# Patient Record
Sex: Male | Born: 1975 | Race: Black or African American | Hispanic: No | Marital: Single | Smoking: Former smoker
Health system: Southern US, Community
[De-identification: ages and names within clinical notes are randomized; demographics above are authoritative.]

## PROBLEM LIST (undated history)

## (undated) DIAGNOSIS — S069XAA Unspecified intracranial injury with loss of consciousness status unknown, initial encounter: Secondary | ICD-10-CM

## (undated) DIAGNOSIS — R569 Unspecified convulsions: Secondary | ICD-10-CM

## (undated) DIAGNOSIS — S069X9A Unspecified intracranial injury with loss of consciousness of unspecified duration, initial encounter: Secondary | ICD-10-CM

## (undated) DIAGNOSIS — F329 Major depressive disorder, single episode, unspecified: Secondary | ICD-10-CM

## (undated) DIAGNOSIS — F32A Depression, unspecified: Secondary | ICD-10-CM

## (undated) HISTORY — PX: BRAIN SURGERY: SHX531

---

## 2012-09-09 ENCOUNTER — Encounter (HOSPITAL_COMMUNITY): Payer: Self-pay | Admitting: *Deleted

## 2012-09-09 ENCOUNTER — Emergency Department (HOSPITAL_COMMUNITY)
Admission: EM | Admit: 2012-09-09 | Discharge: 2012-09-09 | Disposition: A | Discharge status: Home / Self Care | Payer: Medicaid - Out of State | Attending: Emergency Medicine | Admitting: Emergency Medicine

## 2012-09-09 DIAGNOSIS — Z791 Long term (current) use of non-steroidal anti-inflammatories (NSAID): Secondary | ICD-10-CM | POA: Insufficient documentation

## 2012-09-09 DIAGNOSIS — Z87820 Personal history of traumatic brain injury: Secondary | ICD-10-CM | POA: Insufficient documentation

## 2012-09-09 DIAGNOSIS — Z87891 Personal history of nicotine dependence: Secondary | ICD-10-CM | POA: Insufficient documentation

## 2012-09-09 DIAGNOSIS — G40919 Epilepsy, unspecified, intractable, without status epilepticus: Secondary | ICD-10-CM | POA: Insufficient documentation

## 2012-09-09 DIAGNOSIS — R569 Unspecified convulsions: Secondary | ICD-10-CM

## 2012-09-09 DIAGNOSIS — Z79899 Other long term (current) drug therapy: Secondary | ICD-10-CM | POA: Insufficient documentation

## 2012-09-09 HISTORY — DX: Unspecified intracranial injury with loss of consciousness status unknown, initial encounter: S06.9XAA

## 2012-09-09 HISTORY — DX: Unspecified intracranial injury with loss of consciousness of unspecified duration, initial encounter: S06.9X9A

## 2012-09-09 HISTORY — DX: Unspecified convulsions: R56.9

## 2012-09-09 LAB — COMPREHENSIVE METABOLIC PANEL
ALT: 9 U/L (ref 0–53)
AST: 17 U/L (ref 0–37)
Albumin: 3.8 g/dL (ref 3.5–5.2)
CO2: 23 mEq/L (ref 19–32)
Calcium: 9.1 mg/dL (ref 8.4–10.5)
Creatinine, Ser: 0.88 mg/dL (ref 0.50–1.35)
GFR calc non Af Amer: >90 mL/min (ref >90)
Sodium: 138 mEq/L (ref 135–145)
Total Protein: 7.5 g/dL (ref 6.0–8.3)

## 2012-09-09 LAB — CBC WITH DIFFERENTIAL/PLATELET
Basophils Absolute: 0.1 10*3/uL (ref 0.0–0.1)
Eosinophils Relative: 2 % (ref 0–5)
Lymphs Abs: 2.1 10*3/uL (ref 0.7–4.0)
MCH: 32 pg (ref 26.0–34.0)
Monocytes Absolute: 0.4 10*3/uL (ref 0.1–1.0)
Neutrophils Relative %: 63 % (ref 43–77)
Platelets: 209 10*3/uL (ref 150–400)
RBC: 4.34 MIL/uL (ref 4.22–5.81)
RDW: 13.5 % (ref 11.5–15.5)
WBC: 7.3 10*3/uL (ref 4.0–10.5)

## 2012-09-09 LAB — URINALYSIS, ROUTINE W REFLEX MICROSCOPIC
Bilirubin Urine: NEGATIVE
Glucose, UA: NEGATIVE mg/dL
Hgb urine dipstick: NEGATIVE
Specific Gravity, Urine: 1.009 (ref 1.005–1.030)
Urobilinogen, UA: 0.2 mg/dL (ref 0.0–1.0)
pH: 7 (ref 5.0–8.0)

## 2012-09-09 MED ORDER — SODIUM CHLORIDE 0.9 % IV SOLN
1000.0000 mg | Freq: Once | INTRAVENOUS | Status: AC
  Administered 2012-09-09: 1000 mg via INTRAVENOUS
  Filled 2012-09-09: qty 10

## 2012-09-09 MED ORDER — TOPIRAMATE 100 MG PO TABS: 100.0000 mg | ORAL_TABLET | Freq: Two times a day (BID) | ORAL | Status: DC

## 2012-09-09 MED ORDER — MORPHINE SULFATE 4 MG/ML IJ SOLN
4.0000 mg | Freq: Once | INTRAMUSCULAR | Status: AC
  Administered 2012-09-09: 2 mg via INTRAVENOUS
  Filled 2012-09-09: qty 1

## 2012-09-09 MED ORDER — LEVETIRACETAM 500 MG PO TABS: 1000.0000 mg | ORAL_TABLET | Freq: Two times a day (BID) | ORAL | Status: DC

## 2012-09-09 MED ORDER — ONDANSETRON HCL 4 MG/2ML IJ SOLN
4.0000 mg | Freq: Once | INTRAMUSCULAR | Status: AC
  Administered 2012-09-09: 4 mg via INTRAVENOUS
  Filled 2012-09-09: qty 2
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
## 2012-09-09 NOTE — ED Notes (Signed)
Pt arrives by [facility] with c/o witnessed seizure. Pt has hx of same. EMS reports pt was post-ictal on their arrival pt now A&Ox's.4

## 2012-09-09 NOTE — ED Notes (Signed)
ZOX:[id number]<AV> Expected date:09/09/12<BR> Expected time:11:19 AM<BR> Means of arrival:Ambulance<BR> Comments:<BR> Seizures

## 2012-09-09 NOTE — ED Notes (Signed)
Pt reports feeling light headed and dizzy at present.

## 2012-09-10 NOTE — ED Provider Notes (Signed)
Medical screening examination/treatment/procedure(s) were performed by non-physician practitioner and as supervising physician I was immediately available for consultation/collaboration.  Derwood Kaplan, MD 09/10/12 [phone]

## 2012-09-27 ENCOUNTER — Encounter (HOSPITAL_COMMUNITY): Payer: Self-pay | Admitting: Emergency Medicine

## 2012-09-27 ENCOUNTER — Emergency Department (HOSPITAL_COMMUNITY)
Admission: EM | Admit: 2012-09-27 | Discharge: 2012-09-27 | Disposition: A | Discharge status: Home / Self Care | Payer: Medicaid - Out of State | Attending: Emergency Medicine | Admitting: Emergency Medicine

## 2012-09-27 DIAGNOSIS — Z79899 Other long term (current) drug therapy: Secondary | ICD-10-CM | POA: Insufficient documentation

## 2012-09-27 DIAGNOSIS — R569 Unspecified convulsions: Secondary | ICD-10-CM

## 2012-09-27 DIAGNOSIS — Z87891 Personal history of nicotine dependence: Secondary | ICD-10-CM | POA: Insufficient documentation

## 2012-09-27 DIAGNOSIS — G40909 Epilepsy, unspecified, not intractable, without status epilepticus: Secondary | ICD-10-CM | POA: Insufficient documentation

## 2012-09-27 DIAGNOSIS — Z87820 Personal history of traumatic brain injury: Secondary | ICD-10-CM | POA: Insufficient documentation

## 2012-09-27 NOTE — ED Notes (Addendum)
Pt via ems s/p seizure witnessed lasting > . Pt now aox4 does not remember Sz. Pt stated that he has not taken his Topamax for 3 days

## 2012-09-27 NOTE — ED Notes (Signed)
Pt waiting to see Child psychotherapist

## 2012-09-27 NOTE — Progress Notes (Signed)
ED Cm spoke with Claris Che at [address] [place]  [phone] to confirm pt medications Topamax and Keppra are at the pharmacy (since 09/25/12) ready for pick up at zero cost CM updated pt and ED RN, Donell Beers. RN voiced concern with transportation for pt Referred to ED SW. CM called and spoke with ED SW about transportation concerns of pt CM signing off
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
## 2012-09-27 NOTE — Progress Notes (Signed)
CSW was consulted by RN, Donell Beers re: pt transportation back to [employer].  CSW will request assistance from PM CSW re: this consult.    Vickii Penna, LCSWA [phone]  Clinical Social Work

## 2012-09-27 NOTE — Progress Notes (Signed)
[facility] ED CM consulted by EDP, Bednar to assist with medications for this seizure pt CM assess pt who states he receives a disability check that is pending.  Pt states he has medicaid.  [facility] [address] in [place] but unable to do so.  Cm called and spoke with sylvia at [facility] at [address] [place], [place] [address] [phone] who is unable to find the pt listed at this [facility] nor [facility] main data base

## 2012-09-27 NOTE — Progress Notes (Signed)
CSW received referral for transportation assistance. CSW met with pt to assess. Patient provided with cab voucher to [place]. .No further Clinical Social Work needs, signing off.   Catha Gosselin, LCSWA  [phone] .09/27/2012 1516pm

## 2012-09-27 NOTE — ED Notes (Signed)
ZOX:WR60<AV> Expected date:<BR> Expected time:<BR> Means of arrival:<BR> Comments:<BR> seizure

## 2012-09-27 NOTE — Progress Notes (Signed)
[facility] ED CM obtained a copy of patient's insurance card and gave to [place] in ED registration for updating in EPIC.  [facility] [place] pcp listed as [facility]

## 2014-05-24 ENCOUNTER — Emergency Department (HOSPITAL_COMMUNITY)
Admission: EM | Admit: 2014-05-24 | Discharge: 2014-05-24 | Disposition: A | Discharge status: Home / Self Care | Payer: Medicaid Other | Attending: Emergency Medicine | Admitting: Emergency Medicine

## 2014-05-24 ENCOUNTER — Encounter (HOSPITAL_COMMUNITY): Payer: Self-pay | Admitting: Emergency Medicine

## 2014-05-24 ENCOUNTER — Emergency Department (HOSPITAL_COMMUNITY): Payer: Medicaid Other

## 2014-05-24 DIAGNOSIS — G40909 Epilepsy, unspecified, not intractable, without status epilepticus: Secondary | ICD-10-CM | POA: Diagnosis not present

## 2014-05-24 DIAGNOSIS — Z79899 Other long term (current) drug therapy: Secondary | ICD-10-CM | POA: Diagnosis not present

## 2014-05-24 DIAGNOSIS — Z87820 Personal history of traumatic brain injury: Secondary | ICD-10-CM | POA: Insufficient documentation

## 2014-05-24 DIAGNOSIS — Z87891 Personal history of nicotine dependence: Secondary | ICD-10-CM | POA: Diagnosis not present

## 2014-05-24 DIAGNOSIS — R0789 Other chest pain: Secondary | ICD-10-CM | POA: Diagnosis not present

## 2014-05-24 DIAGNOSIS — R079 Chest pain, unspecified: Secondary | ICD-10-CM | POA: Diagnosis present

## 2014-05-24 LAB — D-DIMER, QUANTITATIVE: D-Dimer, Quant: 0.39 ug/mL-FEU (ref 0.00–0.48)

## 2014-05-24 MED ORDER — NAPROXEN 500 MG PO TABS: 500.0000 mg | ORAL_TABLET | Freq: Two times a day (BID) | ORAL | Status: DC

## 2014-05-24 NOTE — Discharge Instructions (Signed)
Your evaluation today for chest discomfort in ED did not show any acute or emergent cause for your symptoms. Take course of naproxen for one week to treat associated chest discomfort. You may followup with your primary care for a repeat chest x-ray in 1 week if symptoms have not improved.  Chest Wall Pain Chest wall pain is pain felt in or around the chest bones and muscles. It may take up to 6 weeks to get better. It may take longer if you are active. Chest wall pain can happen on its own. Other times, things like germs, injury, coughing, or exercise can cause the pain. HOME CARE   Avoid activities that make you tired or cause pain. Try not to use your chest, belly (abdominal), or side muscles. Do not use heavy weights.  Put ice on the sore area.  Put ice in a plastic bag.  Place a towel between your skin and the bag.  Leave the ice on for 15-20 minutes for the first 2 days.  Only take medicine as told by your doctor. GET HELP RIGHT AWAY IF:   You have more pain or are very uncomfortable.  You have a fever.  Your chest pain gets worse.  You have new problems.  You feel sick to your stomach (nauseous) or throw up (vomit).  You start to sweat or feel lightheaded.  You have a cough with mucus (phlegm).  You cough up blood. MAKE SURE YOU:   Understand these instructions.  Will watch your condition.  Will get help right away if you are not doing well or get worse. Document Released: 01/07/2008 Document Revised: 10/13/2011 Document Reviewed: 03/17/2011 [facility] Patient Information 2015 [employer], [place]. This information is not intended to replace advice given to you by your health care provider. Make sure you discuss any questions you have with your health care provider.

## 2014-05-24 NOTE — ED Notes (Signed)
Pt having right rib pain when he breathes. sts he received a flu shot yesterday and hurting since. Pt sts also he had a seizure yesterday. Hx of same.
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
## 2014-05-24 NOTE — ED Provider Notes (Signed)
Medical screening examination/treatment/procedure(s) were performed by non-physician practitioner and as supervising physician I was immediately available for consultation/collaboration.   EKG Interpretation   Date/Time:  Wednesday May 24 2014 16:58:26 EDT Ventricular Rate:  102 PR Interval:  140 QRS Duration: 78 QT Interval:  314 QTC Calculation: 409 R Axis:   107 Text Interpretation:  Sinus tachycardia Biatrial enlargement Right  ventricular hypertrophy No previous tracing Confirmed by Anitra LauthPLUNKETT  MD,  Alphonzo LemmingsWHITNEY ([id number]) on 05/24/2014 8:46:56 PM        Gwyneth SproutWhitney Avelina Mcclurkin, MD 05/24/14 2250

## 2014-07-21 ENCOUNTER — Encounter (HOSPITAL_COMMUNITY): Payer: Self-pay | Admitting: *Deleted

## 2014-07-21 ENCOUNTER — Emergency Department (HOSPITAL_COMMUNITY)
Admission: EM | Admit: 2014-07-21 | Discharge: 2014-07-21 | Disposition: A | Discharge status: Home / Self Care | Payer: Medicaid Other | Attending: Emergency Medicine | Admitting: Emergency Medicine

## 2014-07-21 DIAGNOSIS — G40909 Epilepsy, unspecified, not intractable, without status epilepticus: Secondary | ICD-10-CM | POA: Insufficient documentation

## 2014-07-21 DIAGNOSIS — Z87820 Personal history of traumatic brain injury: Secondary | ICD-10-CM | POA: Diagnosis not present

## 2014-07-21 DIAGNOSIS — R569 Unspecified convulsions: Secondary | ICD-10-CM

## 2014-07-21 DIAGNOSIS — Z720 Tobacco use: Secondary | ICD-10-CM | POA: Insufficient documentation

## 2014-07-21 DIAGNOSIS — Z791 Long term (current) use of non-steroidal anti-inflammatories (NSAID): Secondary | ICD-10-CM | POA: Diagnosis not present

## 2014-07-21 LAB — URINALYSIS, ROUTINE W REFLEX MICROSCOPIC
Bilirubin Urine: NEGATIVE
GLUCOSE, UA: NEGATIVE mg/dL
Hgb urine dipstick: NEGATIVE
KETONES UR: 40 mg/dL — AB
Nitrite: NEGATIVE
Protein, ur: NEGATIVE mg/dL
Specific Gravity, Urine: 1.037 — ABNORMAL HIGH (ref 1.005–1.030)
Urobilinogen, UA: 1 mg/dL (ref 0.0–1.0)
pH: 7.5 (ref 5.0–8.0)

## 2014-07-21 LAB — URINE MICROSCOPIC-ADD ON

## 2014-07-21 LAB — I-STAT CHEM 8, ED
BUN: 18 mg/dL (ref 6–23)
CALCIUM ION: 1.15 mmol/L (ref 1.12–1.23)
CREATININE: 0.9 mg/dL (ref 0.50–1.35)
Chloride: 103 mEq/L (ref 96–112)
Glucose, Bld: 93 mg/dL (ref 70–99)
HCT: 46 % (ref 39.0–52.0)
Hemoglobin: 15.6 g/dL (ref 13.0–17.0)
Potassium: 4.1 mEq/L (ref 3.7–5.3)
Sodium: 141 mEq/L (ref 137–147)
TCO2: 24 mmol/L (ref 0–100)

## 2014-07-21 LAB — VALPROIC ACID LEVEL: VALPROIC ACID LVL: 48.6 ug/mL — AB (ref 50.0–100.0)

## 2014-07-21 MED ORDER — CARBAMAZEPINE 200 MG PO TABS
200.0000 mg | ORAL_TABLET | Freq: Once | ORAL | Status: AC
  Administered 2014-07-21: 200 mg via ORAL
  Filled 2014-07-21: qty 1

## 2014-07-21 MED ORDER — DIVALPROEX SODIUM 250 MG PO DR TAB
500.0000 mg | DELAYED_RELEASE_TABLET | Freq: Once | ORAL | Status: AC
  Administered 2014-07-21: 500 mg via ORAL
  Filled 2014-07-21: qty 2

## 2014-07-21 MED ORDER — CARBAMAZEPINE 200 MG PO TABS: 200.0000 mg | ORAL_TABLET | Freq: Two times a day (BID) | ORAL | Status: DC

## 2014-07-21 NOTE — ED Notes (Signed)
Pt given urinal to provide sample 
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
## 2014-07-21 NOTE — ED Notes (Signed)
Pt to ED via [facility] c/o three seizure like activities at home. Has been out of Tegretol x 3 days and is only taking Keppra. Family reported seizures lasted about 6 minutes and was postical x 15 minutes after the last seizure, which is abnormal. Pt is A&Ox 4 on arrival. VS bp 133/90; HR 81; spO2 99% CBG 104

## 2014-07-21 NOTE — ED Notes (Addendum)
Pt c/o L sided headache after seizures at home today. Has hx of seizures; has been out of tegretol x 3 days and has only been taking Keppra. Family reported to EMS that seizures have last 6 minutes; last seizure included a 15 minute postical phase, which is abnormal for patient. Pt a&ox 4 on arrival.

## 2014-07-21 NOTE — Discharge Instructions (Signed)
Return to the ED with any concerns including recurrent seizure activity, difficulty breathing, fainting, vomiting and not able to keep down medications or liquids, decreased level of alertness/lethargy, or any other alarming symptoms

## 2014-08-30 ENCOUNTER — Encounter (HOSPITAL_COMMUNITY): Payer: Self-pay | Admitting: Emergency Medicine

## 2014-08-30 ENCOUNTER — Emergency Department (HOSPITAL_COMMUNITY)
Admission: EM | Admit: 2014-08-30 | Discharge: 2014-08-30 | Disposition: A | Discharge status: Home / Self Care | Payer: Medicaid Other | Attending: Emergency Medicine | Admitting: Emergency Medicine

## 2014-08-30 DIAGNOSIS — Y998 Other external cause status: Secondary | ICD-10-CM | POA: Diagnosis not present

## 2014-08-30 DIAGNOSIS — Z87820 Personal history of traumatic brain injury: Secondary | ICD-10-CM | POA: Insufficient documentation

## 2014-08-30 DIAGNOSIS — S3992XA Unspecified injury of lower back, initial encounter: Secondary | ICD-10-CM | POA: Diagnosis not present

## 2014-08-30 DIAGNOSIS — Z720 Tobacco use: Secondary | ICD-10-CM | POA: Diagnosis not present

## 2014-08-30 DIAGNOSIS — Z23 Encounter for immunization: Secondary | ICD-10-CM | POA: Diagnosis not present

## 2014-08-30 DIAGNOSIS — G40909 Epilepsy, unspecified, not intractable, without status epilepticus: Secondary | ICD-10-CM | POA: Diagnosis not present

## 2014-08-30 DIAGNOSIS — M545 Low back pain, unspecified: Secondary | ICD-10-CM

## 2014-08-30 DIAGNOSIS — Z79899 Other long term (current) drug therapy: Secondary | ICD-10-CM | POA: Insufficient documentation

## 2014-08-30 DIAGNOSIS — X58XXXA Exposure to other specified factors, initial encounter: Secondary | ICD-10-CM | POA: Diagnosis not present

## 2014-08-30 DIAGNOSIS — R569 Unspecified convulsions: Secondary | ICD-10-CM | POA: Diagnosis present

## 2014-08-30 DIAGNOSIS — Y9289 Other specified places as the place of occurrence of the external cause: Secondary | ICD-10-CM | POA: Diagnosis not present

## 2014-08-30 DIAGNOSIS — Y9389 Activity, other specified: Secondary | ICD-10-CM | POA: Insufficient documentation

## 2014-08-30 LAB — VALPROIC ACID LEVEL: Valproic Acid Lvl: 56.9 ug/mL (ref 50.0–100.0)

## 2014-08-30 LAB — BASIC METABOLIC PANEL
Anion gap: 9 (ref 5–15)
BUN: 15 mg/dL (ref 6–23)
CALCIUM: 9.7 mg/dL (ref 8.4–10.5)
CO2: 25 mmol/L (ref 19–32)
Chloride: 102 mmol/L (ref 96–112)
Creatinine, Ser: 0.99 mg/dL (ref 0.50–1.35)
GFR calc Af Amer: >90 mL/min (ref >90)
Glucose, Bld: 102 mg/dL — ABNORMAL HIGH (ref 70–99)
POTASSIUM: 4.3 mmol/L (ref 3.5–5.1)
Sodium: 136 mmol/L (ref 135–145)

## 2014-08-30 LAB — CBC
HCT: 41 % (ref 39.0–52.0)
HEMOGLOBIN: 14.3 g/dL (ref 13.0–17.0)
MCH: 32 pg (ref 26.0–34.0)
MCHC: 34.9 g/dL (ref 30.0–36.0)
MCV: 91.7 fL (ref 78.0–100.0)
Platelets: 216 10*3/uL (ref 150–400)
RBC: 4.47 MIL/uL (ref 4.22–5.81)
RDW: 14 % (ref 11.5–15.5)
WBC: 11.1 10*3/uL — ABNORMAL HIGH (ref 4.0–10.5)

## 2014-08-30 LAB — CARBAMAZEPINE LEVEL, TOTAL

## 2014-08-30 MED ORDER — CARBAMAZEPINE 200 MG PO TABS
200.0000 mg | ORAL_TABLET | Freq: Once | ORAL | Status: AC
  Administered 2014-08-30: 200 mg via ORAL
  Filled 2014-08-30: qty 1

## 2014-08-30 MED ORDER — CARBAMAZEPINE 200 MG PO TABS: 200.0000 mg | ORAL_TABLET | Freq: Two times a day (BID) | ORAL | Status: DC

## 2014-08-30 MED ORDER — OXYCODONE-ACETAMINOPHEN 5-325 MG PO TABS
1.0000 | ORAL_TABLET | Freq: Once | ORAL | Status: AC
  Administered 2014-08-30: 1 via ORAL
  Filled 2014-08-30: qty 1

## 2014-08-30 MED ORDER — HYDROCODONE-ACETAMINOPHEN 5-325 MG PO TABS: 1.0000 | ORAL_TABLET | Freq: Four times a day (QID) | ORAL | Status: AC | PRN

## 2014-08-30 MED ORDER — TETANUS-DIPHTH-ACELL PERTUSSIS 5-2.5-18.5 LF-MCG/0.5 IM SUSP
0.5000 mL | Freq: Once | INTRAMUSCULAR | Status: AC
  Administered 2014-08-30: 0.5 mL via INTRAMUSCULAR
  Filled 2014-08-30: qty 0.5
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
## 2014-08-30 NOTE — ED Notes (Signed)
Pt presents with multiple seizures over the past 3 days, last seizure was around 4pm.  Pt admits to being out of Carbamazepine for the past week.  Pt currently alert and oriented X 4.  Pt admits to having pain to his right lower back after falling after one of his seizures.  Denies other injuries.

## 2014-08-30 NOTE — ED Notes (Signed)
NAD at this time. Pt is stable and going home with his mother.

## 2014-10-17 ENCOUNTER — Encounter (HOSPITAL_COMMUNITY): Payer: Self-pay | Admitting: Emergency Medicine

## 2014-10-17 ENCOUNTER — Emergency Department (HOSPITAL_COMMUNITY)
Admission: EM | Admit: 2014-10-17 | Discharge: 2014-10-17 | Disposition: A | Discharge status: Home / Self Care | Payer: Medicaid Other | Attending: Emergency Medicine | Admitting: Emergency Medicine

## 2014-10-17 ENCOUNTER — Emergency Department (HOSPITAL_COMMUNITY): Payer: Medicaid Other

## 2014-10-17 DIAGNOSIS — Y929 Unspecified place or not applicable: Secondary | ICD-10-CM | POA: Diagnosis not present

## 2014-10-17 DIAGNOSIS — S0003XA Contusion of scalp, initial encounter: Secondary | ICD-10-CM | POA: Insufficient documentation

## 2014-10-17 DIAGNOSIS — W01198A Fall on same level from slipping, tripping and stumbling with subsequent striking against other object, initial encounter: Secondary | ICD-10-CM | POA: Insufficient documentation

## 2014-10-17 DIAGNOSIS — S161XXA Strain of muscle, fascia and tendon at neck level, initial encounter: Secondary | ICD-10-CM | POA: Insufficient documentation

## 2014-10-17 DIAGNOSIS — Z720 Tobacco use: Secondary | ICD-10-CM | POA: Diagnosis not present

## 2014-10-17 DIAGNOSIS — Z79899 Other long term (current) drug therapy: Secondary | ICD-10-CM | POA: Insufficient documentation

## 2014-10-17 DIAGNOSIS — Y939 Activity, unspecified: Secondary | ICD-10-CM | POA: Insufficient documentation

## 2014-10-17 DIAGNOSIS — R569 Unspecified convulsions: Secondary | ICD-10-CM

## 2014-10-17 DIAGNOSIS — Y999 Unspecified external cause status: Secondary | ICD-10-CM | POA: Insufficient documentation

## 2014-10-17 DIAGNOSIS — G40909 Epilepsy, unspecified, not intractable, without status epilepticus: Secondary | ICD-10-CM | POA: Insufficient documentation

## 2014-10-17 DIAGNOSIS — S199XXA Unspecified injury of neck, initial encounter: Secondary | ICD-10-CM | POA: Diagnosis present

## 2014-10-17 LAB — BASIC METABOLIC PANEL
ANION GAP: 7 (ref 5–15)
BUN: 13 mg/dL (ref 6–23)
CALCIUM: 9 mg/dL (ref 8.4–10.5)
CO2: 26 mmol/L (ref 19–32)
CREATININE: 0.84 mg/dL (ref 0.50–1.35)
Chloride: 106 mmol/L (ref 96–112)
GFR calc non Af Amer: >90 mL/min (ref >90)
GLUCOSE: 98 mg/dL (ref 70–99)
Potassium: 3.8 mmol/L (ref 3.5–5.1)
Sodium: 139 mmol/L (ref 135–145)

## 2014-10-17 LAB — CARBAMAZEPINE LEVEL, TOTAL: Carbamazepine Lvl: 6.5 ug/mL (ref 4.0–12.0)

## 2014-10-17 LAB — VALPROIC ACID LEVEL: Valproic Acid Lvl: 25.3 ug/mL — ABNORMAL LOW (ref 50.0–100.0)

## 2014-10-17 MED ORDER — SODIUM CHLORIDE 0.9 % IV BOLUS (SEPSIS)
500.0000 mL | Freq: Once | INTRAVENOUS | Status: AC
  Administered 2014-10-17: 500 mL via INTRAVENOUS

## 2014-10-17 MED ORDER — ACETAMINOPHEN 325 MG PO TABS
650.0000 mg | ORAL_TABLET | Freq: Once | ORAL | Status: AC
  Administered 2014-10-17: 650 mg via ORAL
  Filled 2014-10-17: qty 2
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
## 2014-10-17 NOTE — Discharge Instructions (Signed)
No driving or operating machinery. Take an extra valproate dose today and follow up with neurology.  If you were given medicines take as directed.  If you are on coumadin or contraceptives realize their levels and effectiveness is altered by many different medicines.  If you have any reaction (rash, tongues swelling, other) to the medicines stop taking and see a physician.   Please follow up as directed and return to the ER or see a physician for new or worsening symptoms.  Thank you. Filed Vitals:   10/17/14 0246  BP: 127/92  Pulse: 71  Temp: 97.9 F (36.6 C)  TempSrc: Oral  Resp: 15  SpO2: 99%

## 2014-10-17 NOTE — ED Notes (Signed)
Pt verbalized understanding of d/c instructions and has no further questions.  

## 2015-01-09 ENCOUNTER — Emergency Department (HOSPITAL_COMMUNITY)
Admission: EM | Admit: 2015-01-09 | Discharge: 2015-01-09 | Disposition: A | Discharge status: Home / Self Care | Payer: Medicaid Other | Attending: Emergency Medicine | Admitting: Emergency Medicine

## 2015-01-09 ENCOUNTER — Encounter (HOSPITAL_COMMUNITY): Payer: Self-pay

## 2015-01-09 DIAGNOSIS — G40909 Epilepsy, unspecified, not intractable, without status epilepticus: Secondary | ICD-10-CM | POA: Diagnosis not present

## 2015-01-09 DIAGNOSIS — Z79899 Other long term (current) drug therapy: Secondary | ICD-10-CM | POA: Insufficient documentation

## 2015-01-09 DIAGNOSIS — Z87820 Personal history of traumatic brain injury: Secondary | ICD-10-CM | POA: Diagnosis not present

## 2015-01-09 DIAGNOSIS — Z720 Tobacco use: Secondary | ICD-10-CM | POA: Diagnosis not present

## 2015-01-09 DIAGNOSIS — R569 Unspecified convulsions: Secondary | ICD-10-CM | POA: Diagnosis present

## 2015-01-09 LAB — CBC WITH DIFFERENTIAL/PLATELET
Basophils Absolute: 0 10*3/uL (ref 0.0–0.1)
Basophils Relative: 1 % (ref 0–1)
EOS ABS: 0.1 10*3/uL (ref 0.0–0.7)
Eosinophils Relative: 1 % (ref 0–5)
HCT: 38.2 % — ABNORMAL LOW (ref 39.0–52.0)
Hemoglobin: 12.8 g/dL — ABNORMAL LOW (ref 13.0–17.0)
LYMPHS ABS: 2 10*3/uL (ref 0.7–4.0)
Lymphocytes Relative: 33 % (ref 12–46)
MCH: 31.8 pg (ref 26.0–34.0)
MCHC: 33.5 g/dL (ref 30.0–36.0)
MCV: 95 fL (ref 78.0–100.0)
Monocytes Absolute: 0.4 10*3/uL (ref 0.1–1.0)
Monocytes Relative: 7 % (ref 3–12)
NEUTROS PCT: 58 % (ref 43–77)
Neutro Abs: 3.5 10*3/uL (ref 1.7–7.7)
PLATELETS: 183 10*3/uL (ref 150–400)
RBC: 4.02 MIL/uL — ABNORMAL LOW (ref 4.22–5.81)
RDW: 13.9 % (ref 11.5–15.5)
WBC: 6 10*3/uL (ref 4.0–10.5)

## 2015-01-09 LAB — CARBAMAZEPINE LEVEL, TOTAL: CARBAMAZEPINE LVL: 4.4 ug/mL (ref 4.0–12.0)

## 2015-01-09 LAB — COMPREHENSIVE METABOLIC PANEL
ALT: 10 U/L — AB (ref 17–63)
AST: 18 U/L (ref 15–41)
Albumin: 3.5 g/dL (ref 3.5–5.0)
Alkaline Phosphatase: 57 U/L (ref 38–126)
Anion gap: 9 (ref 5–15)
BUN: 8 mg/dL (ref 6–20)
CALCIUM: 9 mg/dL (ref 8.9–10.3)
CO2: 27 mmol/L (ref 22–32)
CREATININE: 0.86 mg/dL (ref 0.61–1.24)
Chloride: 105 mmol/L (ref 101–111)
GLUCOSE: 94 mg/dL (ref 65–99)
POTASSIUM: 3.6 mmol/L (ref 3.5–5.1)
SODIUM: 141 mmol/L (ref 135–145)
TOTAL PROTEIN: 6.6 g/dL (ref 6.5–8.1)
Total Bilirubin: 0.4 mg/dL (ref 0.3–1.2)

## 2015-01-09 LAB — VALPROIC ACID LEVEL: VALPROIC ACID LVL: 45 ug/mL — AB (ref 50.0–100.0)

## 2015-01-09 MED ORDER — LORAZEPAM 2 MG/ML IJ SOLN
1.0000 mg | Freq: Once | INTRAMUSCULAR | Status: AC
  Administered 2015-01-09: 1 mg via INTRAVENOUS
  Filled 2015-01-09: qty 1

## 2015-01-09 MED ORDER — VALPROATE SODIUM 500 MG/5ML IV SOLN
500.0000 mg | Freq: Once | INTRAVENOUS | Status: AC
  Administered 2015-01-09: 500 mg via INTRAVENOUS
  Filled 2015-01-09: qty 5

## 2015-01-09 NOTE — Discharge Instructions (Signed)
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
## 2015-01-09 NOTE — ED Notes (Signed)
Bed: WA24 Expected date:  Expected time:  Means of arrival:  Comments: Ems seizure 
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
## 2015-01-15 ENCOUNTER — Emergency Department (HOSPITAL_COMMUNITY)
Admission: EM | Admit: 2015-01-15 | Discharge: 2015-01-15 | Disposition: A | Discharge status: Home / Self Care | Payer: Medicaid Other | Attending: Emergency Medicine | Admitting: Emergency Medicine

## 2015-01-15 ENCOUNTER — Emergency Department (HOSPITAL_COMMUNITY): Payer: Medicaid Other

## 2015-01-15 ENCOUNTER — Encounter (HOSPITAL_COMMUNITY): Payer: Self-pay | Admitting: Emergency Medicine

## 2015-01-15 DIAGNOSIS — S0081XA Abrasion of other part of head, initial encounter: Secondary | ICD-10-CM | POA: Diagnosis not present

## 2015-01-15 DIAGNOSIS — Y9289 Other specified places as the place of occurrence of the external cause: Secondary | ICD-10-CM | POA: Insufficient documentation

## 2015-01-15 DIAGNOSIS — Y9389 Activity, other specified: Secondary | ICD-10-CM | POA: Insufficient documentation

## 2015-01-15 DIAGNOSIS — R569 Unspecified convulsions: Secondary | ICD-10-CM

## 2015-01-15 DIAGNOSIS — Z720 Tobacco use: Secondary | ICD-10-CM | POA: Diagnosis not present

## 2015-01-15 DIAGNOSIS — Z87820 Personal history of traumatic brain injury: Secondary | ICD-10-CM | POA: Diagnosis not present

## 2015-01-15 DIAGNOSIS — Z79899 Other long term (current) drug therapy: Secondary | ICD-10-CM | POA: Insufficient documentation

## 2015-01-15 DIAGNOSIS — G40909 Epilepsy, unspecified, not intractable, without status epilepticus: Secondary | ICD-10-CM | POA: Diagnosis not present

## 2015-01-15 DIAGNOSIS — Y998 Other external cause status: Secondary | ICD-10-CM | POA: Diagnosis not present

## 2015-01-15 DIAGNOSIS — X58XXXA Exposure to other specified factors, initial encounter: Secondary | ICD-10-CM | POA: Diagnosis not present

## 2015-01-15 LAB — CBC WITH DIFFERENTIAL/PLATELET
Basophils Absolute: 0 10*3/uL (ref 0.0–0.1)
Basophils Relative: 0 % (ref 0–1)
EOS PCT: 0 % (ref 0–5)
Eosinophils Absolute: 0 10*3/uL (ref 0.0–0.7)
HEMATOCRIT: 42.2 % (ref 39.0–52.0)
Hemoglobin: 14.7 g/dL (ref 13.0–17.0)
Lymphocytes Relative: 23 % (ref 12–46)
Lymphs Abs: 1.8 10*3/uL (ref 0.7–4.0)
MCH: 32.7 pg (ref 26.0–34.0)
MCHC: 34.8 g/dL (ref 30.0–36.0)
MCV: 94 fL (ref 78.0–100.0)
MONO ABS: 0.4 10*3/uL (ref 0.1–1.0)
MONOS PCT: 5 % (ref 3–12)
NEUTROS ABS: 5.4 10*3/uL (ref 1.7–7.7)
Neutrophils Relative %: 72 % (ref 43–77)
Platelets: 228 10*3/uL (ref 150–400)
RBC: 4.49 MIL/uL (ref 4.22–5.81)
RDW: 13.8 % (ref 11.5–15.5)
WBC: 7.6 10*3/uL (ref 4.0–10.5)

## 2015-01-15 LAB — COMPREHENSIVE METABOLIC PANEL
ALT: 9 U/L — ABNORMAL LOW (ref 17–63)
AST: 18 U/L (ref 15–41)
Albumin: 4.5 g/dL (ref 3.5–5.0)
Alkaline Phosphatase: 65 U/L (ref 38–126)
Anion gap: 10 (ref 5–15)
BUN: 12 mg/dL (ref 6–20)
CALCIUM: 9.8 mg/dL (ref 8.9–10.3)
CHLORIDE: 104 mmol/L (ref 101–111)
CO2: 26 mmol/L (ref 22–32)
Creatinine, Ser: 1.01 mg/dL (ref 0.61–1.24)
GFR calc non Af Amer: >60 mL/min (ref >60)
Glucose, Bld: 101 mg/dL — ABNORMAL HIGH (ref 65–99)
Potassium: 4 mmol/L (ref 3.5–5.1)
Sodium: 140 mmol/L (ref 135–145)
TOTAL PROTEIN: 7.7 g/dL (ref 6.5–8.1)
Total Bilirubin: 0.4 mg/dL (ref 0.3–1.2)

## 2015-01-15 LAB — VALPROIC ACID LEVEL: Valproic Acid Lvl: 58 ug/mL (ref 50.0–100.0)

## 2015-01-15 LAB — CARBAMAZEPINE LEVEL, TOTAL: CARBAMAZEPINE LVL: 5.4 ug/mL (ref 4.0–12.0)

## 2015-01-15 MED ORDER — SODIUM CHLORIDE 0.9 % IV BOLUS (SEPSIS)
1000.0000 mL | Freq: Once | INTRAVENOUS | Status: AC
  Administered 2015-01-15: 1000 mL via INTRAVENOUS

## 2015-01-15 MED ORDER — MORPHINE SULFATE 4 MG/ML IJ SOLN
4.0000 mg | Freq: Once | INTRAMUSCULAR | Status: AC
  Administered 2015-01-15: 4 mg via INTRAVENOUS
  Filled 2015-01-15: qty 1

## 2015-01-15 NOTE — ED Notes (Signed)
PA at bedside.
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
## 2015-01-15 NOTE — ED Notes (Signed)
Pt sts he just woke up and realized he had an abrasion to L eye and figured he must have had a seizure. Pt has been taking medications as prescribed. C.o feeling dizzy.

## 2015-01-15 NOTE — Discharge Instructions (Signed)
Read the information below.  You may return to the Emergency Department at any time for worsening condition or any new symptoms that concern you.  If you develop uncontrolled pain, prolonged seizure, persistent confusion, weakness or numbness of your arms or legs, or fevers, call 911 or return to the Emergency Department immediately for a recheck.     Seizure, Adult A seizure means there is unusual activity in the brain. A seizure can cause changes in attention or behavior. Seizures often cause shaking (convulsions). Seizures often last from 30 seconds to 2 minutes. HOME CARE   If you are given medicines, take them exactly as told by your doctor.  Keep all doctor visits as told.  Do not swim or drive until your doctor says it is okay.  Teach others what to do if you have a seizure. They should:  Lay you on the ground.  Put a cushion under your head.  Loosen any tight clothing around your neck.  Turn you on your side.  Stay with you until you get better. GET HELP RIGHT AWAY IF:   The seizure lasts longer than 2 to 5 minutes.  The seizure is very bad.  The person does not wake up after the seizure.  The person's attention or behavior changes. Drive the person to the emergency room or call your local emergency services (911 in [place] MAKE SURE YOU:   Understand these instructions.  Will watch your condition.  Will get help right away if you are not doing well or get worse. Document Released: 01/07/2008 Document Revised: 10/13/2011 Document Reviewed: 07/09/2011 [facility] Patient Information 2015 [place], [place]. This information is not intended to replace advice given to you by your health care provider. Make sure you discuss any questions you have with your health care provider.

## 2015-01-15 NOTE — Care Management (Signed)
ED CM received consult regarding establsihing  follow- up care. Patient presented to [facility] ED with seizures. Met with patient at bedside, patient confirms he has Medicaid is active, and states, the has moved several times, and as a result he does not have a PCP, Offered assistance with finding a PCP in [place]. Discussed the [facility] and the services rendered, patient is a agreeable with establishing care there. Scheduled a walk-in follow up appointment at the clinic. Wednesday 6/16 at 11:30am. Patient given information, patient verbalized understanding and appreciation for the assistance. Updated E. West PA-C on [place] [facility] regarding  f/u she is agreeable to discharge plan. No further ED CM needs identified.

## 2015-01-17 ENCOUNTER — Inpatient Hospital Stay: Payer: Medicaid Other

## 2016-01-04 ENCOUNTER — Encounter (HOSPITAL_COMMUNITY): Payer: Self-pay | Admitting: Nurse Practitioner

## 2016-01-04 ENCOUNTER — Emergency Department (HOSPITAL_COMMUNITY)
Admission: EM | Admit: 2016-01-04 | Discharge: 2016-01-04 | Disposition: A | Discharge status: Home / Self Care | Payer: Medicaid Other | Attending: Emergency Medicine | Admitting: Emergency Medicine

## 2016-01-04 ENCOUNTER — Emergency Department (HOSPITAL_COMMUNITY): Payer: Medicaid Other

## 2016-01-04 DIAGNOSIS — Z79899 Other long term (current) drug therapy: Secondary | ICD-10-CM | POA: Diagnosis not present

## 2016-01-04 DIAGNOSIS — Y939 Activity, unspecified: Secondary | ICD-10-CM | POA: Diagnosis not present

## 2016-01-04 DIAGNOSIS — Y929 Unspecified place or not applicable: Secondary | ICD-10-CM | POA: Insufficient documentation

## 2016-01-04 DIAGNOSIS — G40909 Epilepsy, unspecified, not intractable, without status epilepticus: Secondary | ICD-10-CM | POA: Insufficient documentation

## 2016-01-04 DIAGNOSIS — W06XXXA Fall from bed, initial encounter: Secondary | ICD-10-CM | POA: Diagnosis not present

## 2016-01-04 DIAGNOSIS — S0990XA Unspecified injury of head, initial encounter: Secondary | ICD-10-CM | POA: Diagnosis present

## 2016-01-04 DIAGNOSIS — S0081XA Abrasion of other part of head, initial encounter: Secondary | ICD-10-CM | POA: Diagnosis not present

## 2016-01-04 DIAGNOSIS — R569 Unspecified convulsions: Secondary | ICD-10-CM

## 2016-01-04 DIAGNOSIS — F17210 Nicotine dependence, cigarettes, uncomplicated: Secondary | ICD-10-CM | POA: Insufficient documentation

## 2016-01-04 DIAGNOSIS — Y999 Unspecified external cause status: Secondary | ICD-10-CM | POA: Insufficient documentation

## 2016-01-04 LAB — COMPREHENSIVE METABOLIC PANEL
ALT: 10 U/L — ABNORMAL LOW (ref 17–63)
ANION GAP: 6 (ref 5–15)
AST: 14 U/L — ABNORMAL LOW (ref 15–41)
Albumin: 4.3 g/dL (ref 3.5–5.0)
Alkaline Phosphatase: 68 U/L (ref 38–126)
BUN: 14 mg/dL (ref 6–20)
CALCIUM: 9.6 mg/dL (ref 8.9–10.3)
CO2: 32 mmol/L (ref 22–32)
Chloride: 102 mmol/L (ref 101–111)
Creatinine, Ser: 1.04 mg/dL (ref 0.61–1.24)
GFR calc Af Amer: >60 mL/min (ref >60)
GFR calc non Af Amer: >60 mL/min (ref >60)
Glucose, Bld: 94 mg/dL (ref 65–99)
Potassium: 4.5 mmol/L (ref 3.5–5.1)
SODIUM: 140 mmol/L (ref 135–145)
Total Bilirubin: 0.5 mg/dL (ref 0.3–1.2)
Total Protein: 6.9 g/dL (ref 6.5–8.1)

## 2016-01-04 LAB — CBC WITH DIFFERENTIAL/PLATELET
BASOS PCT: 1 %
Basophils Absolute: 0.1 10*3/uL (ref 0.0–0.1)
Eosinophils Absolute: 0 10*3/uL (ref 0.0–0.7)
Eosinophils Relative: 0 %
HEMATOCRIT: 36.5 % — AB (ref 39.0–52.0)
HEMOGLOBIN: 12.7 g/dL — AB (ref 13.0–17.0)
Lymphocytes Relative: 25 %
Lymphs Abs: 2.7 10*3/uL (ref 0.7–4.0)
MCH: 32.4 pg (ref 26.0–34.0)
MCHC: 34.8 g/dL (ref 30.0–36.0)
MCV: 93.1 fL (ref 78.0–100.0)
MONOS PCT: 8 %
Monocytes Absolute: 0.9 10*3/uL (ref 0.1–1.0)
NEUTROS ABS: 7.2 10*3/uL (ref 1.7–7.7)
NEUTROS PCT: 66 %
Platelets: 186 10*3/uL (ref 150–400)
RBC: 3.92 MIL/uL — AB (ref 4.22–5.81)
RDW: 14.5 % (ref 11.5–15.5)
WBC: 10.9 10*3/uL — ABNORMAL HIGH (ref 4.0–10.5)

## 2016-01-04 MED ORDER — HYDROCODONE-ACETAMINOPHEN 5-325 MG PO TABS
1.0000 | ORAL_TABLET | Freq: Once | ORAL | Status: AC
  Administered 2016-01-04: 1 via ORAL
  Filled 2016-01-04: qty 1

## 2016-01-04 NOTE — Discharge Instructions (Signed)
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
## 2016-01-04 NOTE — ED Notes (Signed)
Patient d/c'd, declined wheelchair to lobby.

## 2016-01-04 NOTE — ED Notes (Signed)
Bed: [id number] Expected date:  Expected time:  Means of arrival:  Comments: EMS 32 - seizure, hx of

## 2016-01-04 NOTE — ED Notes (Signed)
Patient d/c'd self care.  F/U reviewed.  Patient and family member verbalized understanding.
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
## 2016-03-24 ENCOUNTER — Encounter (HOSPITAL_COMMUNITY): Payer: Self-pay

## 2016-03-24 ENCOUNTER — Emergency Department (HOSPITAL_COMMUNITY): Payer: Medicaid Other

## 2016-03-24 ENCOUNTER — Emergency Department (HOSPITAL_COMMUNITY)
Admission: EM | Admit: 2016-03-24 | Discharge: 2016-03-24 | Disposition: A | Discharge status: Home / Self Care | Payer: Medicaid Other | Attending: Emergency Medicine | Admitting: Emergency Medicine

## 2016-03-24 DIAGNOSIS — F17210 Nicotine dependence, cigarettes, uncomplicated: Secondary | ICD-10-CM | POA: Insufficient documentation

## 2016-03-24 DIAGNOSIS — R51 Headache: Secondary | ICD-10-CM | POA: Diagnosis not present

## 2016-03-24 DIAGNOSIS — R569 Unspecified convulsions: Secondary | ICD-10-CM | POA: Insufficient documentation

## 2016-03-24 LAB — CBC
HEMATOCRIT: 37.9 % — AB (ref 39.0–52.0)
Hemoglobin: 12.7 g/dL — ABNORMAL LOW (ref 13.0–17.0)
MCH: 32.5 pg (ref 26.0–34.0)
MCHC: 33.5 g/dL (ref 30.0–36.0)
MCV: 96.9 fL (ref 78.0–100.0)
Platelets: 248 10*3/uL (ref 150–400)
RBC: 3.91 MIL/uL — ABNORMAL LOW (ref 4.22–5.81)
RDW: 16.8 % — AB (ref 11.5–15.5)
WBC: 10 10*3/uL (ref 4.0–10.5)

## 2016-03-24 LAB — BASIC METABOLIC PANEL
ANION GAP: 6 (ref 5–15)
BUN: 13 mg/dL (ref 6–20)
CALCIUM: 9.5 mg/dL (ref 8.9–10.3)
CO2: 29 mmol/L (ref 22–32)
Chloride: 107 mmol/L (ref 101–111)
Creatinine, Ser: 1.16 mg/dL (ref 0.61–1.24)
GFR calc non Af Amer: >60 mL/min (ref >60)
Glucose, Bld: 89 mg/dL (ref 65–99)
Potassium: 3.9 mmol/L (ref 3.5–5.1)
SODIUM: 142 mmol/L (ref 135–145)

## 2016-03-24 LAB — VALPROIC ACID LEVEL

## 2016-03-24 MED ORDER — ACETAMINOPHEN 325 MG PO TABS
325.0000 mg | ORAL_TABLET | Freq: Once | ORAL | Status: AC
  Administered 2016-03-24: 325 mg via ORAL
  Filled 2016-03-24: qty 1

## 2016-03-24 MED ORDER — SODIUM CHLORIDE 0.9 % IV SOLN
1000.0000 mg | Freq: Once | INTRAVENOUS | Status: AC
  Administered 2016-03-24: 1000 mg via INTRAVENOUS
  Filled 2016-03-24: qty 10

## 2016-03-24 MED ORDER — DIVALPROEX SODIUM 250 MG PO DR TAB
500.0000 mg | DELAYED_RELEASE_TABLET | Freq: Once | ORAL | Status: AC
  Administered 2016-03-24: 500 mg via ORAL
  Filled 2016-03-24: qty 2
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
## 2016-03-24 NOTE — ED Triage Notes (Signed)
Pt brought in by EMS for seizures. Per girlfriend pt had a seizure for 3-5 minutes. Per EMS pt was confused when they arrived. Pt is a&ox4 now.

## 2016-05-07 ENCOUNTER — Emergency Department (HOSPITAL_COMMUNITY)
Admission: EM | Admit: 2016-05-07 | Discharge: 2016-05-07 | Disposition: A | Discharge status: Home / Self Care | Payer: Medicaid Other | Attending: Emergency Medicine | Admitting: Emergency Medicine

## 2016-05-07 ENCOUNTER — Encounter (HOSPITAL_COMMUNITY): Payer: Self-pay

## 2016-05-07 DIAGNOSIS — G40909 Epilepsy, unspecified, not intractable, without status epilepticus: Secondary | ICD-10-CM | POA: Diagnosis not present

## 2016-05-07 DIAGNOSIS — R569 Unspecified convulsions: Secondary | ICD-10-CM

## 2016-05-07 DIAGNOSIS — F17210 Nicotine dependence, cigarettes, uncomplicated: Secondary | ICD-10-CM | POA: Insufficient documentation

## 2016-05-07 LAB — BASIC METABOLIC PANEL
ANION GAP: 10 (ref 5–15)
BUN: 16 mg/dL (ref 6–20)
CALCIUM: 9.1 mg/dL (ref 8.9–10.3)
CO2: 27 mmol/L (ref 22–32)
CREATININE: 1.23 mg/dL (ref 0.61–1.24)
Chloride: 102 mmol/L (ref 101–111)
Glucose, Bld: 72 mg/dL (ref 65–99)
Potassium: 4.2 mmol/L (ref 3.5–5.1)
SODIUM: 139 mmol/L (ref 135–145)

## 2016-05-07 LAB — CBC WITH DIFFERENTIAL/PLATELET
Basophils Absolute: 0.1 10*3/uL (ref 0.0–0.1)
Basophils Relative: 1 %
Eosinophils Absolute: 0 10*3/uL (ref 0.0–0.7)
Eosinophils Relative: 0 %
HCT: 39.9 % (ref 39.0–52.0)
Hemoglobin: 13.4 g/dL (ref 13.0–17.0)
Lymphocytes Relative: 26 %
Lymphs Abs: 1.8 10*3/uL (ref 0.7–4.0)
MCH: 33.7 pg (ref 26.0–34.0)
MCHC: 33.6 g/dL (ref 30.0–36.0)
MCV: 100.3 fL — ABNORMAL HIGH (ref 78.0–100.0)
Monocytes Absolute: 0.5 10*3/uL (ref 0.1–1.0)
Monocytes Relative: 7 %
Neutro Abs: 4.6 10*3/uL (ref 1.7–7.7)
Neutrophils Relative %: 66 %
Platelets: 73 10*3/uL — ABNORMAL LOW (ref 150–400)
RBC: 3.98 MIL/uL — ABNORMAL LOW (ref 4.22–5.81)
RDW: 15 % (ref 11.5–15.5)
WBC: 7 10*3/uL (ref 4.0–10.5)

## 2016-05-07 LAB — VALPROIC ACID LEVEL: Valproic Acid Lvl: 112 ug/mL — ABNORMAL HIGH (ref 50.0–100.0)

## 2016-05-07 LAB — CBG MONITORING, ED: Glucose-Capillary: 79 mg/dL (ref 65–99)

## 2016-05-07 MED ORDER — LORAZEPAM 2 MG/ML IJ SOLN: 1.0000 mg | Freq: Once | INTRAMUSCULAR | Status: DC

## 2016-05-07 MED ORDER — LORAZEPAM 2 MG/ML IJ SOLN
INTRAMUSCULAR | Status: AC
  Filled 2016-05-07: qty 1

## 2016-05-07 MED ORDER — LORAZEPAM 2 MG/ML IJ SOLN
1.0000 mg | Freq: Once | INTRAMUSCULAR | Status: AC
  Administered 2016-05-07: 1 mg via INTRAVENOUS

## 2016-05-07 NOTE — ED Notes (Signed)
Attempted to draw back blood w/ IV start, unsuccessful. Attempted to obtain blood w/ butterfly needle, unsuccessful. Phleb to draw blood.

## 2016-05-07 NOTE — ED Notes (Signed)
Called to bedside because pt had become unresponsive. Pt was not responding to verbal stimuli ad was just staring into space. Dr. Juleen ChinaKohut came to bedside to assess pt. Pt did become responsive after a few minutes.
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
## 2016-05-07 NOTE — ED Triage Notes (Addendum)
Per EMS - pt had 2 witnessed full-body seizures, started w/ twitching. A&O x 4 w/ EMS. Pt acute change in mental status upon arrival to ED. Dr. Juleen ChinaKohut at bedside. Pt also ate full paper towel prior to EMS arrival. Known hx of seizures, takes keppra and depakote 3x daily

## 2016-08-11 ENCOUNTER — Encounter (HOSPITAL_COMMUNITY): Payer: Self-pay | Admitting: Emergency Medicine

## 2016-08-11 ENCOUNTER — Emergency Department (HOSPITAL_COMMUNITY)
Admission: EM | Admit: 2016-08-11 | Discharge: 2016-08-11 | Disposition: A | Discharge status: Home / Self Care | Payer: Medicaid Other | Attending: Emergency Medicine | Admitting: Emergency Medicine

## 2016-08-11 DIAGNOSIS — Z87891 Personal history of nicotine dependence: Secondary | ICD-10-CM | POA: Diagnosis not present

## 2016-08-11 DIAGNOSIS — G40909 Epilepsy, unspecified, not intractable, without status epilepticus: Secondary | ICD-10-CM | POA: Insufficient documentation

## 2016-08-11 DIAGNOSIS — T426X6A Underdosing of other antiepileptic and sedative-hypnotic drugs, initial encounter: Secondary | ICD-10-CM | POA: Diagnosis not present

## 2016-08-11 DIAGNOSIS — R569 Unspecified convulsions: Secondary | ICD-10-CM

## 2016-08-11 LAB — COMPREHENSIVE METABOLIC PANEL
ALT: 12 U/L — AB (ref 17–63)
AST: 23 U/L (ref 15–41)
Albumin: 3.4 g/dL — ABNORMAL LOW (ref 3.5–5.0)
Alkaline Phosphatase: 46 U/L (ref 38–126)
Anion gap: 7 (ref 5–15)
BILIRUBIN TOTAL: 0.7 mg/dL (ref 0.3–1.2)
BUN: 19 mg/dL (ref 6–20)
CO2: 28 mmol/L (ref 22–32)
CREATININE: 0.92 mg/dL (ref 0.61–1.24)
Calcium: 8.8 mg/dL — ABNORMAL LOW (ref 8.9–10.3)
Chloride: 106 mmol/L (ref 101–111)
GFR calc Af Amer: >60 mL/min (ref >60)
Glucose, Bld: 126 mg/dL — ABNORMAL HIGH (ref 65–99)
POTASSIUM: 4.1 mmol/L (ref 3.5–5.1)
Sodium: 141 mmol/L (ref 135–145)
TOTAL PROTEIN: 6.5 g/dL (ref 6.5–8.1)

## 2016-08-11 LAB — CBC WITH DIFFERENTIAL/PLATELET
BASOS ABS: 0.1 10*3/uL (ref 0.0–0.1)
Basophils Relative: 0 %
Eosinophils Absolute: 0 10*3/uL (ref 0.0–0.7)
Eosinophils Relative: 0 %
HEMATOCRIT: 35.8 % — AB (ref 39.0–52.0)
Hemoglobin: 12.6 g/dL — ABNORMAL LOW (ref 13.0–17.0)
LYMPHS ABS: 4.6 10*3/uL — AB (ref 0.7–4.0)
LYMPHS PCT: 39 %
MCH: 34.7 pg — ABNORMAL HIGH (ref 26.0–34.0)
MCHC: 35.2 g/dL (ref 30.0–36.0)
MCV: 98.6 fL (ref 78.0–100.0)
MONO ABS: 0.7 10*3/uL (ref 0.1–1.0)
Monocytes Relative: 6 %
NEUTROS ABS: 6.6 10*3/uL (ref 1.7–7.7)
Neutrophils Relative %: 55 %
Platelets: 127 10*3/uL — ABNORMAL LOW (ref 150–400)
RBC: 3.63 MIL/uL — ABNORMAL LOW (ref 4.22–5.81)
RDW: 15.2 % (ref 11.5–15.5)
WBC: 11.9 10*3/uL — ABNORMAL HIGH (ref 4.0–10.5)

## 2016-08-11 LAB — CBG MONITORING, ED: GLUCOSE-CAPILLARY: 103 mg/dL — AB (ref 65–99)

## 2016-08-11 MED ORDER — DIVALPROEX SODIUM 500 MG PO DR TAB: 500.0000 mg | DELAYED_RELEASE_TABLET | Freq: Three times a day (TID) | ORAL | 0 refills | Status: AC

## 2016-08-11 MED ORDER — LEVETIRACETAM 1000 MG PO TABS: 1000.0000 mg | ORAL_TABLET | Freq: Two times a day (BID) | ORAL | 0 refills | Status: AC

## 2016-08-11 MED ORDER — VALPROATE SODIUM 500 MG/5ML IV SOLN
1250.0000 mg | Freq: Once | INTRAVENOUS | Status: AC
  Administered 2016-08-11: 1250 mg via INTRAVENOUS
  Filled 2016-08-11: qty 12.5

## 2016-08-11 MED ORDER — SODIUM CHLORIDE 0.9 % IV BOLUS (SEPSIS)
1000.0000 mL | Freq: Once | INTRAVENOUS | Status: AC
  Administered 2016-08-11: 1000 mL via INTRAVENOUS

## 2016-08-11 MED ORDER — SODIUM CHLORIDE 0.9 % IV SOLN
1000.0000 mg | Freq: Once | INTRAVENOUS | Status: AC
  Administered 2016-08-11: 1000 mg via INTRAVENOUS
  Filled 2016-08-11: qty 10

## 2016-08-11 NOTE — Discharge Instructions (Signed)
We have given you 5 days supply of your seizure meds. Please follow-up with your neurologist to get more.  Please return with any concerns.

## 2016-08-11 NOTE — ED Notes (Signed)
Tried to ambulate patient at bedside but patient was dizzy and shaky upon standing and unable to walk. Patient wanted to try again about 30 minutes later and was able to walk slightly down the hall without shaking but was still dizzy. He became disoriented and almost passed out.

## 2016-08-11 NOTE — ED Notes (Signed)
Pt ambulated to the restroom with no assistance.

## 2016-08-11 NOTE — ED Triage Notes (Signed)
With triage pt complaint of seizures, takes Depakote and Keppra. Pt states "not from here" and came to ED today related to needing medication refill, has not had medications for seizures in "a few days." Pt continues to report 5/10 headache post seizure. Pt alert and oriented x4.

## 2016-08-11 NOTE — ED Triage Notes (Signed)
Per EMS witnessed seizure at 0900-grandmal-lasted about 2 minutes-post ictal for 20-took am meds-called EMS due to increased post ictal state-oriented X 4 in route, complaining of headache-CBG 159-BP 118/60, HR 98
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
## 2016-08-11 NOTE — ED Notes (Signed)
Pt's cousin, Junius CreamerCrystal Pressley: [phone].  Call if pt is discharged and needs a ride.

## 2016-08-11 NOTE — ED Notes (Signed)
Bed: WA18 Expected date:  Expected time:  Means of arrival:  Comments: EMS-SZ 

## 2016-08-11 NOTE — ED Provider Notes (Signed)
Received care from Dr. Corlis LeakMackuen at [facility]. Briefly this is a 41yo male with history of seizure disorder who has been off of his seizure medications and had a seizure today.  Witness at bedside reports it was similar to past seizures.  No prior head trauma, doubt intracranial bleed. Patient received depakote, keppra, initially was dizzy. Labs and IV fluids ordered.  Labs without significant abnormalities.  Patient given IV fluid. Now able to ambulate independently with steady gait, improved.  Rx given by Dr. Corlis LeakMackuen and Neuro follow up recommended.    Alvira MondayErin Kavonte Bearse, MD 08/13/16 1104

## 2016-08-11 NOTE — ED Notes (Signed)
Pt eating meal at present time. Post meal, plan to ambulate.

## 2016-08-28 ENCOUNTER — Encounter (HOSPITAL_COMMUNITY): Payer: Self-pay

## 2016-08-28 ENCOUNTER — Emergency Department (HOSPITAL_COMMUNITY)
Admission: EM | Admit: 2016-08-28 | Discharge: 2016-08-28 | Disposition: A | Discharge status: Home / Self Care | Payer: Medicaid Other | Attending: Emergency Medicine | Admitting: Emergency Medicine

## 2016-08-28 DIAGNOSIS — G40909 Epilepsy, unspecified, not intractable, without status epilepticus: Secondary | ICD-10-CM | POA: Diagnosis not present

## 2016-08-28 DIAGNOSIS — Z87891 Personal history of nicotine dependence: Secondary | ICD-10-CM | POA: Insufficient documentation

## 2016-08-28 DIAGNOSIS — R569 Unspecified convulsions: Secondary | ICD-10-CM

## 2016-08-28 LAB — CBC WITH DIFFERENTIAL/PLATELET
BASOS ABS: 0 10*3/uL (ref 0.0–0.1)
Basophils Relative: 1 %
Eosinophils Absolute: 0 10*3/uL (ref 0.0–0.7)
Eosinophils Relative: 0 %
HEMATOCRIT: 40.7 % (ref 39.0–52.0)
Hemoglobin: 13.9 g/dL (ref 13.0–17.0)
LYMPHS PCT: 34 %
Lymphs Abs: 2.1 10*3/uL (ref 0.7–4.0)
MCH: 34.7 pg — ABNORMAL HIGH (ref 26.0–34.0)
MCHC: 34.2 g/dL (ref 30.0–36.0)
MCV: 101.5 fL — AB (ref 78.0–100.0)
MONO ABS: 0.3 10*3/uL (ref 0.1–1.0)
Monocytes Relative: 5 %
NEUTROS ABS: 3.7 10*3/uL (ref 1.7–7.7)
Neutrophils Relative %: 61 %
Platelets: 69 10*3/uL — ABNORMAL LOW (ref 150–400)
RBC: 4.01 MIL/uL — ABNORMAL LOW (ref 4.22–5.81)
RDW: 14.5 % (ref 11.5–15.5)
WBC: 6.2 10*3/uL (ref 4.0–10.5)

## 2016-08-28 LAB — HEPATIC FUNCTION PANEL
ALBUMIN: 3.3 g/dL — AB (ref 3.5–5.0)
ALK PHOS: 50 U/L (ref 38–126)
ALT: 14 U/L — AB (ref 17–63)
AST: 21 U/L (ref 15–41)
BILIRUBIN TOTAL: 0.7 mg/dL (ref 0.3–1.2)
Bilirubin, Direct: 0.1 mg/dL (ref 0.1–0.5)
Indirect Bilirubin: 0.6 mg/dL (ref 0.3–0.9)
Total Protein: 6.3 g/dL — ABNORMAL LOW (ref 6.5–8.1)

## 2016-08-28 LAB — BASIC METABOLIC PANEL
ANION GAP: 10 (ref 5–15)
BUN: 21 mg/dL — ABNORMAL HIGH (ref 6–20)
CO2: 27 mmol/L (ref 22–32)
Calcium: 9.7 mg/dL (ref 8.9–10.3)
Chloride: 103 mmol/L (ref 101–111)
Creatinine, Ser: 1.15 mg/dL (ref 0.61–1.24)
GFR calc Af Amer: >60 mL/min (ref >60)
GFR calc non Af Amer: >60 mL/min (ref >60)
Glucose, Bld: 69 mg/dL (ref 65–99)
POTASSIUM: 4.8 mmol/L (ref 3.5–5.1)
Sodium: 140 mmol/L (ref 135–145)

## 2016-08-28 LAB — VALPROIC ACID LEVEL: Valproic Acid Lvl: 140 ug/mL — ABNORMAL HIGH (ref 50.0–100.0)

## 2016-08-28 MED ORDER — ACETAMINOPHEN 325 MG PO TABS
650.0000 mg | ORAL_TABLET | Freq: Once | ORAL | Status: AC
  Administered 2016-08-28: 650 mg via ORAL
  Filled 2016-08-28: qty 2

## 2016-08-28 NOTE — ED Notes (Signed)
Pt taken a sandwich and a sprite.

## 2016-08-28 NOTE — Discharge Instructions (Signed)
Taking your Depakote and Keppra as prescribed.  [facility] and return to the emergency room for any new or worsening symptoms.

## 2016-08-28 NOTE — ED Triage Notes (Signed)
Pt brought in by EMS due to having seizure per family members. Per EMS pt had witnessed seizure and slid out of chair. Pt denies hitting head. Pt a&ox4.
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
## 2016-11-26 ENCOUNTER — Emergency Department (HOSPITAL_COMMUNITY)
Admission: EM | Admit: 2016-11-26 | Discharge: 2016-11-26 | Disposition: A | Discharge status: Home / Self Care | Payer: Medicaid Other | Attending: Emergency Medicine | Admitting: Emergency Medicine

## 2016-11-26 ENCOUNTER — Encounter (HOSPITAL_COMMUNITY): Payer: Self-pay | Admitting: Emergency Medicine

## 2016-11-26 ENCOUNTER — Emergency Department (HOSPITAL_COMMUNITY): Payer: Medicaid Other

## 2016-11-26 DIAGNOSIS — J690 Pneumonitis due to inhalation of food and vomit: Secondary | ICD-10-CM | POA: Insufficient documentation

## 2016-11-26 DIAGNOSIS — G40909 Epilepsy, unspecified, not intractable, without status epilepticus: Secondary | ICD-10-CM | POA: Insufficient documentation

## 2016-11-26 DIAGNOSIS — Z87891 Personal history of nicotine dependence: Secondary | ICD-10-CM | POA: Insufficient documentation

## 2016-11-26 DIAGNOSIS — R569 Unspecified convulsions: Secondary | ICD-10-CM

## 2016-11-26 LAB — CBC WITH DIFFERENTIAL/PLATELET
Basophils Absolute: 0.1 10*3/uL (ref 0.0–0.1)
Basophils Relative: 1 %
Eosinophils Absolute: 0 10*3/uL (ref 0.0–0.7)
Eosinophils Relative: 0 %
HCT: 36.4 % — ABNORMAL LOW (ref 39.0–52.0)
Hemoglobin: 12.3 g/dL — ABNORMAL LOW (ref 13.0–17.0)
Lymphocytes Relative: 34 %
Lymphs Abs: 3.9 10*3/uL (ref 0.7–4.0)
MCH: 34.2 pg — ABNORMAL HIGH (ref 26.0–34.0)
MCHC: 33.8 g/dL (ref 30.0–36.0)
MCV: 101.1 fL — ABNORMAL HIGH (ref 78.0–100.0)
Monocytes Absolute: 0.9 10*3/uL (ref 0.1–1.0)
Monocytes Relative: 8 %
Neutro Abs: 6.5 10*3/uL (ref 1.7–7.7)
Neutrophils Relative %: 57 %
Platelets: 168 10*3/uL (ref 150–400)
RBC: 3.6 MIL/uL — ABNORMAL LOW (ref 4.22–5.81)
RDW: 14.1 % (ref 11.5–15.5)
WBC: 11.4 10*3/uL — ABNORMAL HIGH (ref 4.0–10.5)

## 2016-11-26 LAB — BASIC METABOLIC PANEL
Anion gap: 9 (ref 5–15)
BUN: 16 mg/dL (ref 6–20)
CO2: 26 mmol/L (ref 22–32)
Calcium: 9.1 mg/dL (ref 8.9–10.3)
Chloride: 103 mmol/L (ref 101–111)
Creatinine, Ser: 1.03 mg/dL (ref 0.61–1.24)
GFR calc Af Amer: >60 mL/min (ref >60)
GFR calc non Af Amer: >60 mL/min (ref >60)
Glucose, Bld: 75 mg/dL (ref 65–99)
Potassium: 3.9 mmol/L (ref 3.5–5.1)
Sodium: 138 mmol/L (ref 135–145)

## 2016-11-26 LAB — VALPROIC ACID LEVEL: Valproic Acid Lvl: 102 ug/mL — ABNORMAL HIGH (ref 50.0–100.0)

## 2016-11-26 MED ORDER — AMOXICILLIN-POT CLAVULANATE 875-125 MG PO TABS: 1.0000 | ORAL_TABLET | Freq: Two times a day (BID) | ORAL | 0 refills | Status: AC

## 2016-11-26 MED ORDER — AMOXICILLIN-POT CLAVULANATE 875-125 MG PO TABS
1.0000 | ORAL_TABLET | Freq: Once | ORAL | Status: AC
  Administered 2016-11-26: 1 via ORAL
  Filled 2016-11-26: qty 1

## 2016-11-26 MED ORDER — ACETAMINOPHEN 325 MG PO TABS
650.0000 mg | ORAL_TABLET | Freq: Once | ORAL | Status: AC
  Administered 2016-11-26: 650 mg via ORAL
  Filled 2016-11-26: qty 2
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
## 2016-11-26 NOTE — ED Provider Notes (Signed)
Last week 2 szs, today 1 sz. Back to baseline Kepra level pending States he has been compliant  Plan: Home on Augmentin for ?aspiration PNA. If kepra normal he can be discharged home, follow up neuro and/or PCP.  Keppra level will not be returned tonight. He has had no further seizures in the emergency department. He can be discharged home and Keppra level followed by outpatient physician.   Elpidio Anis, PA-C 11/28/16 2222    Loren Racer, MD 12/01/16 [phone]

## 2016-11-26 NOTE — ED Notes (Signed)
Patient transported to CT 

## 2016-11-26 NOTE — Discharge Instructions (Signed)
Please read attached information. If you experience any new or worsening signs or symptoms please return to the emergency room for evaluation. Please follow-up with your primary care provider or specialist as discussed. Please use medication prescribed only as directed and discontinue taking if you have any concerning signs or symptoms.   °
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
## 2016-11-27 LAB — LEVETIRACETAM LEVEL: Levetiracetam Lvl: 71.1 ug/mL — ABNORMAL HIGH (ref 10.0–40.0)

## 2017-01-26 ENCOUNTER — Encounter (HOSPITAL_COMMUNITY): Payer: Self-pay | Admitting: *Deleted

## 2017-01-26 ENCOUNTER — Emergency Department (HOSPITAL_COMMUNITY)
Admission: EM | Admit: 2017-01-26 | Discharge: 2017-01-27 | Disposition: A | Discharge status: Home / Self Care | Payer: Medicaid Other | Attending: Emergency Medicine | Admitting: Emergency Medicine

## 2017-01-26 DIAGNOSIS — Z87891 Personal history of nicotine dependence: Secondary | ICD-10-CM | POA: Insufficient documentation

## 2017-01-26 DIAGNOSIS — F329 Major depressive disorder, single episode, unspecified: Secondary | ICD-10-CM | POA: Diagnosis not present

## 2017-01-26 DIAGNOSIS — F4321 Adjustment disorder with depressed mood: Secondary | ICD-10-CM

## 2017-01-26 DIAGNOSIS — R45850 Homicidal ideations: Secondary | ICD-10-CM | POA: Diagnosis not present

## 2017-01-26 DIAGNOSIS — F32A Depression, unspecified: Secondary | ICD-10-CM

## 2017-01-26 DIAGNOSIS — G40909 Epilepsy, unspecified, not intractable, without status epilepticus: Secondary | ICD-10-CM | POA: Diagnosis not present

## 2017-01-26 DIAGNOSIS — Z79899 Other long term (current) drug therapy: Secondary | ICD-10-CM | POA: Diagnosis not present

## 2017-01-26 HISTORY — DX: Major depressive disorder, single episode, unspecified: F32.9

## 2017-01-26 HISTORY — DX: Depression, unspecified: F32.A

## 2017-01-26 LAB — COMPREHENSIVE METABOLIC PANEL
ALT: 21 U/L (ref 17–63)
AST: 33 U/L (ref 15–41)
Albumin: 4.1 g/dL (ref 3.5–5.0)
Alkaline Phosphatase: 53 U/L (ref 38–126)
Anion gap: 9 (ref 5–15)
BILIRUBIN TOTAL: 0.9 mg/dL (ref 0.3–1.2)
BUN: 28 mg/dL — AB (ref 6–20)
CO2: 27 mmol/L (ref 22–32)
CREATININE: 1.08 mg/dL (ref 0.61–1.24)
Calcium: 9.7 mg/dL (ref 8.9–10.3)
Chloride: 104 mmol/L (ref 101–111)
GFR calc Af Amer: >60 mL/min (ref >60)
Glucose, Bld: 84 mg/dL (ref 65–99)
POTASSIUM: 4.1 mmol/L (ref 3.5–5.1)
Sodium: 140 mmol/L (ref 135–145)
TOTAL PROTEIN: 7.8 g/dL (ref 6.5–8.1)

## 2017-01-26 LAB — ACETAMINOPHEN LEVEL: Acetaminophen (Tylenol), Serum: <10 ug/mL — ABNORMAL LOW (ref 10–30)

## 2017-01-26 LAB — CBC
HCT: 41.7 % (ref 39.0–52.0)
Hemoglobin: 14.7 g/dL (ref 13.0–17.0)
MCH: 34.4 pg — AB (ref 26.0–34.0)
MCHC: 35.3 g/dL (ref 30.0–36.0)
MCV: 97.7 fL (ref 78.0–100.0)
PLATELETS: UNDETERMINED 10*3/uL (ref 150–400)
RBC: 4.27 MIL/uL (ref 4.22–5.81)
RDW: 14.1 % (ref 11.5–15.5)
WBC: 7.7 10*3/uL (ref 4.0–10.5)

## 2017-01-26 LAB — SALICYLATE LEVEL: Salicylate Lvl: <7 mg/dL (ref 2.8–30.0)

## 2017-01-26 LAB — RAPID URINE DRUG SCREEN, HOSP PERFORMED
Amphetamines: NOT DETECTED
Barbiturates: NOT DETECTED
Benzodiazepines: NOT DETECTED
Cocaine: NOT DETECTED
Opiates: NOT DETECTED
Tetrahydrocannabinol: POSITIVE — AB

## 2017-01-26 LAB — ETHANOL

## 2017-01-26 MED ORDER — ACETAMINOPHEN 325 MG PO TABS
650.0000 mg | ORAL_TABLET | ORAL | Status: DC | PRN
  Administered 2017-01-26: 650 mg via ORAL
  Filled 2017-01-26: qty 2

## 2017-01-26 MED ORDER — DIVALPROEX SODIUM 500 MG PO DR TAB
1000.0000 mg | DELAYED_RELEASE_TABLET | Freq: Three times a day (TID) | ORAL | Status: DC
  Administered 2017-01-26 – 2017-01-27 (×3): 1000 mg via ORAL
  Filled 2017-01-26 (×3): qty 2

## 2017-01-26 MED ORDER — ALUM & MAG HYDROXIDE-SIMETH 200-200-20 MG/5ML PO SUSP
30.0000 mL | Freq: Four times a day (QID) | ORAL | Status: DC | PRN
Start: 2017-01-26 — End: 2017-01-27

## 2017-01-26 MED ORDER — LEVETIRACETAM 500 MG PO TABS
1000.0000 mg | ORAL_TABLET | Freq: Two times a day (BID) | ORAL | Status: DC
  Administered 2017-01-26 – 2017-01-27 (×2): 1000 mg via ORAL
  Filled 2017-01-26 (×3): qty 2

## 2017-01-26 MED ORDER — ONDANSETRON HCL 4 MG PO TABS: 4.0000 mg | ORAL_TABLET | Freq: Three times a day (TID) | ORAL | Status: DC | PRN

## 2017-01-26 MED ORDER — NICOTINE 21 MG/24HR TD PT24
21.0000 mg | MEDICATED_PATCH | Freq: Every day | TRANSDERMAL | Status: DC
  Administered 2017-01-26: 21 mg via TRANSDERMAL
  Filled 2017-01-26: qty 1

## 2017-01-26 NOTE — ED Notes (Signed)
Bed: [facility] Expected date:  Expected time:  Means of arrival:  Comments: 

## 2017-01-26 NOTE — ED Notes (Signed)
Introduced self to patient. Pt oriented to unit expectations.  Assessed pt for:  A) Anxiety &/or agitation:  Affect flat, mood depressed, behavior appropriate.  Pt states that he has been in rehab for marijuana and had a seizure Saturday. He gets seizures sometimes. Lately his appetite has been poor, and he has been having thoughts of harming some people. He denies drug use other than marijuana.   S) Safety: Safety maintained with q-15-minute checks and hourly rounds by staff.  A) ADLs: Pt able to perform ADLs independently.  P) Pick-Up (room cleanliness): Pt's room clean and free of clutter.
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
## 2017-01-26 NOTE — ED Triage Notes (Signed)
Patient is alert and oriented x4. On arrival to the ED.  EMS called to [facility]"  Patient found to have a blood sugar of 64.  EMS gave oral glucose 15g.  Patient blood glucose is 99 on arrival to the ED.  Patient has no Hx of diabetes.  He adds that he has been depressed and not eating.

## 2017-01-26 NOTE — ED Notes (Signed)
Met patient sleeping at this time. Respiration even and unlabored. No distress noted.
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
## 2017-01-27 DIAGNOSIS — Z91018 Allergy to other foods: Secondary | ICD-10-CM

## 2017-01-27 DIAGNOSIS — F329 Major depressive disorder, single episode, unspecified: Secondary | ICD-10-CM | POA: Insufficient documentation

## 2017-01-27 DIAGNOSIS — F32A Depression, unspecified: Secondary | ICD-10-CM | POA: Insufficient documentation

## 2017-01-27 DIAGNOSIS — F4321 Adjustment disorder with depressed mood: Secondary | ICD-10-CM

## 2017-01-27 DIAGNOSIS — R45850 Homicidal ideations: Secondary | ICD-10-CM

## 2017-01-27 DIAGNOSIS — Z886 Allergy status to analgesic agent status: Secondary | ICD-10-CM

## 2017-01-27 DIAGNOSIS — Z79899 Other long term (current) drug therapy: Secondary | ICD-10-CM | POA: Diagnosis not present

## 2017-01-27 DIAGNOSIS — G40909 Epilepsy, unspecified, not intractable, without status epilepticus: Secondary | ICD-10-CM

## 2017-01-27 DIAGNOSIS — Z87891 Personal history of nicotine dependence: Secondary | ICD-10-CM

## 2017-01-27 NOTE — BHH Suicide Risk Assessment (Signed)
Suicide Risk Assessment  Discharge Assessment   [facility]   Principal Problem: Adjustment disorder with depressed mood Discharge Diagnoses:  Patient Active Problem List   Diagnosis Date Noted  . Adjustment disorder with depressed mood [F43.21] 01/27/2017  . Depression [F32.9]     Total Time spent with patient: 30 minutes  Musculoskeletal: Strength & Muscle Tone: within normal limits Gait & Station: normal Patient leans: N/A Psychiatric Specialty Exam: Physical Exam  Constitutional: He is oriented to person, place, and time. He appears well-developed and well-nourished.  HENT:  Head: Normocephalic.  Neck: Normal range of motion.  Respiratory: Effort normal.  Musculoskeletal: Normal range of motion.  Neurological: He is alert and oriented to person, place, and time.  Psychiatric: He has a normal mood and affect. His speech is normal and behavior is normal. Judgment and thought content normal. Cognition and memory are normal.   Review of Systems  Psychiatric/Behavioral: Positive for depression. Negative for hallucinations, memory loss, substance abuse and suicidal ideas. The patient is not nervous/anxious and does not have insomnia.    Blood pressure 107/74, pulse 66, temperature 97.8 F (36.6 C), temperature source Oral, resp. rate 16, SpO2 99 %.There is no height or weight on file to calculate BMI. General Appearance: Casual Eye Contact:  Good Speech:  Clear and Coherent and Normal Rate Volume:  Normal Mood:  Depressed Affect:  Congruent and Depressed Thought Process:  Coherent, Goal Directed and Linear Orientation:  Full (Time, Place, and Person) Thought Content:  Logical Suicidal Thoughts:  No Homicidal Thoughts:  No Memory:  Immediate;   Good Recent;   Good Remote;   Fair Judgement:  Fair Insight:  Fair Psychomotor Activity:  Normal Concentration:  Concentration: Good and Attention Span: Good Recall:  Good Fund of Knowledge:   Good Language:  Good Akathisia:  No Handed:  Right AIMS (if indicated):    Assets:  Communication Skills Desire for Improvement Financial Resources/Insurance Housing Resilience Social Support ADL's:  Intact Cognition:  WNL Sleep:      Mental Status Per Nursing Assessment::   On Admission:   Depressed  Demographic Factors:  Male  Loss Factors: NA  Historical Factors: NA  Risk Reduction Factors:   Positive social support and Positive therapeutic relationship  Continued Clinical Symptoms:  Depression:   Comorbid alcohol abuse/dependence Impulsivity More than one psychiatric diagnosis Medical Diagnoses and Treatments/Surgeries  Cognitive Features That Contribute To Risk:  None    Suicide Risk:  Minimal: No identifiable suicidal ideation.  Patients presenting with no risk factors but with morbid ruminations; may be classified as minimal risk based on the severity of the depressive symptoms    Plan Of Care/Follow-up recommendations:  Activity:  as tolerated Diet:  Heart healthy  Laveda AbbeLaurie Britton Amanee Iacovelli, NP 01/27/2017, 12:33 PM

## 2017-01-27 NOTE — BH Assessment (Signed)
[facility] Assessment Progress Note  Per Thedore MinsMojeed Akintayo, MD, this pt does not require psychiatric hospitalization at this time.  [facility]  Pt does not require behavioral health resources at this time.  Pt's nurse, Diane, has been notified.  Doylene Canninghomas Lorre Opdahl, MA Triage Specialist [phone]

## 2017-01-27 NOTE — ED Notes (Signed)
Pt discharged home. Discharged instructions read to pt who verbalized understanding. All belongings returned to pt who signed for same. Denies SI/HI, is not delusional and not responding to internal stimuli. Escorted pt to the ED exit.    
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
## 2017-04-14 ENCOUNTER — Telehealth: Payer: Self-pay | Admitting: Neurology

## 2017-04-14 NOTE — Telephone Encounter (Signed)
Please call pt at [phone] to schedule [facility] appt. Thank you. JBA

## 2017-04-15 NOTE — Telephone Encounter (Signed)
George Friedman, I dont see where this patient has been seen in office yet?

## 2017-04-15 NOTE — Telephone Encounter (Signed)
Error

## 2017-04-16 ENCOUNTER — Telehealth: Payer: Self-pay | Admitting: *Deleted

## 2017-04-16 ENCOUNTER — Ambulatory Visit: Payer: Medicaid Other | Admitting: Neurology

## 2017-04-16 NOTE — Telephone Encounter (Signed)
No showed new patient appointment. 

## 2017-04-20 ENCOUNTER — Encounter: Payer: Self-pay | Admitting: Neurology

## 2018-09-02 IMAGING — CT CT HEAD W/O CM
4 series · 20 of 47 positions shown, 22 images · non-contrast
Comparison: 03/24/2016

CLINICAL DATA: Unwitnessed seizure.  Found down.

EXAM:
CT HEAD WITHOUT CONTRAST
TECHNIQUE: Contiguous axial images were obtained from the base of the skull
through the vertex without intravenous contrast.

[Series 201: head w/o, idose (1) · axial · non-contrast · 0.44mm/px · z∈[+286,+406]mm · 8 of 32 slices shown, 10 images]
[im 4/32  brain]
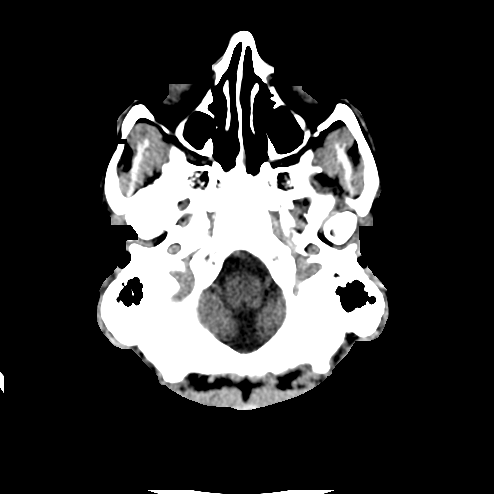
[im 4/32  bone]
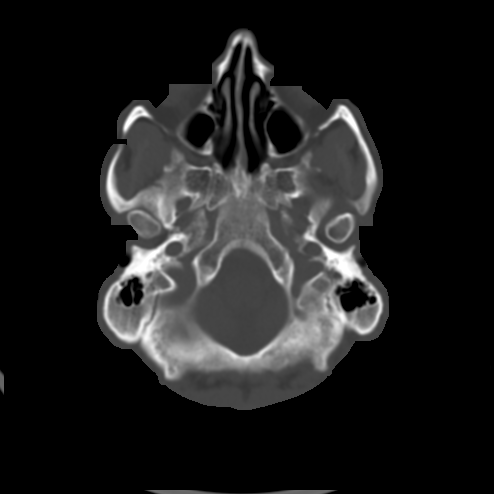
[im 7/32  brain]
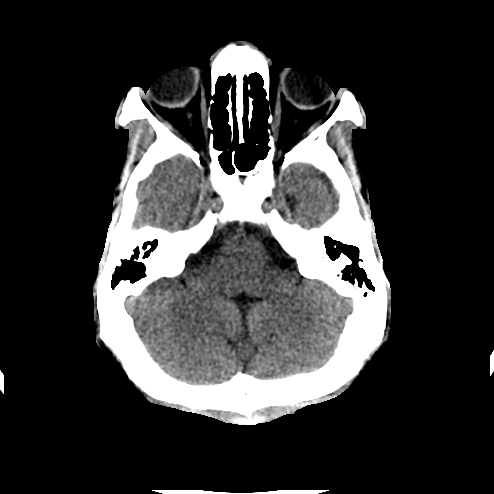
[im 11/32  brain]
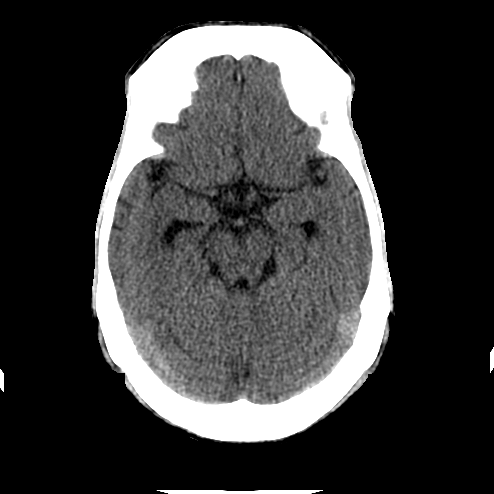
[im 14/32  brain]
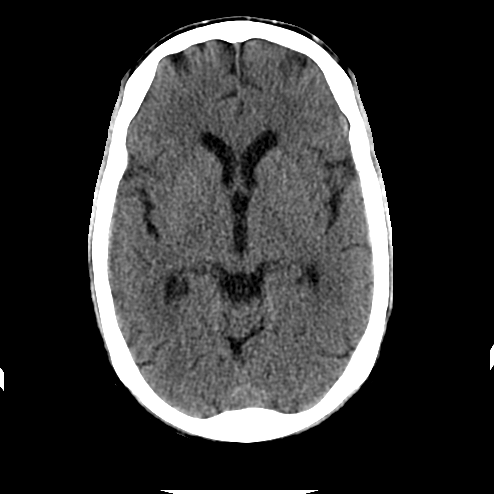
[im 18/32  brain]
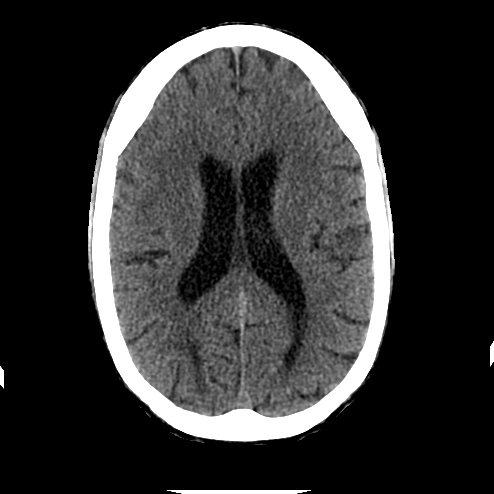
[im 18/32  bone]
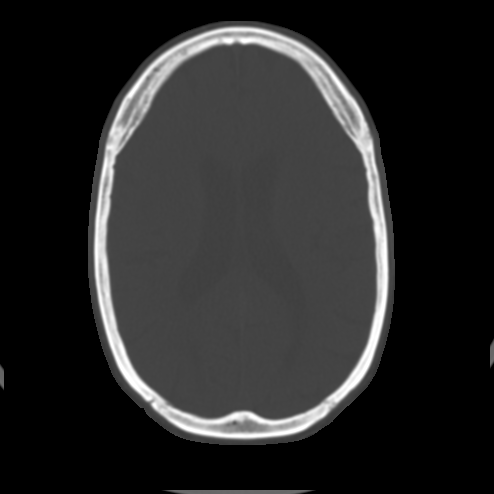
[im 21/32  brain]
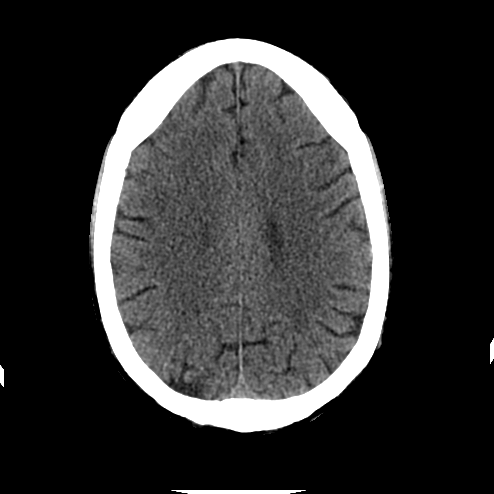
[im 25/32  brain]
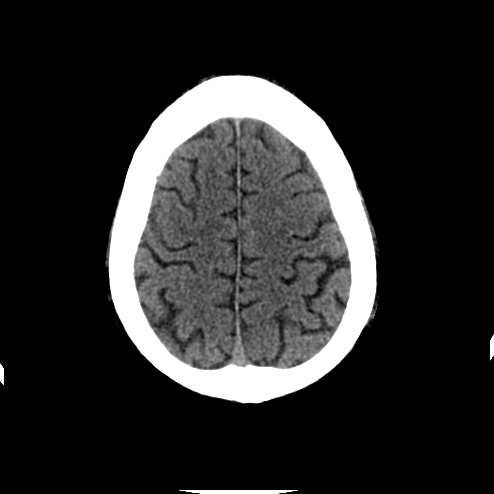
[im 28/32  brain]
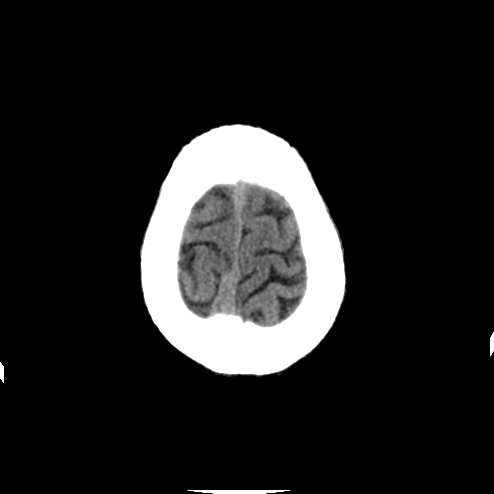

[Series 202: head w/o bone, idose (1) · axial · non-contrast · 0.44mm/px · z∈[+285,+377]mm · 6 of 64 slices shown]
[im 7/64  bone]
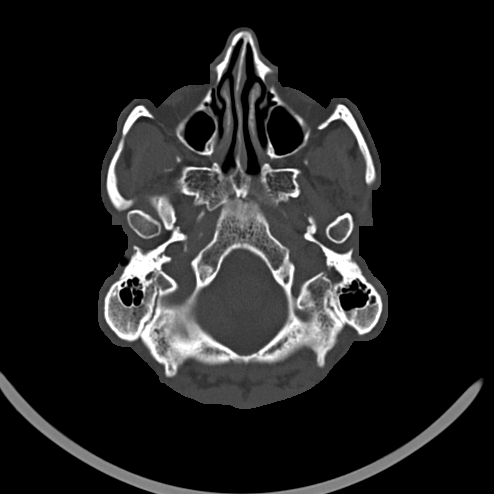
[im 14/64  bone]
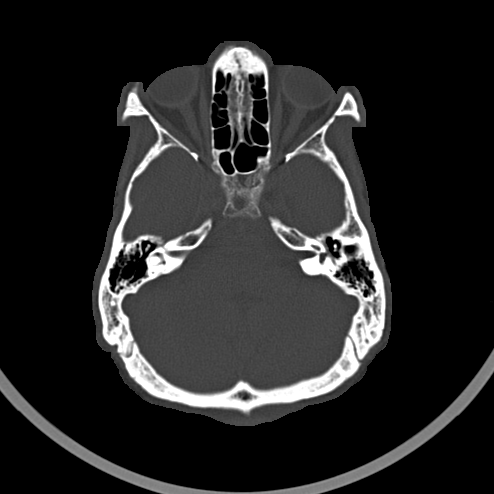
[im 20/64  bone]
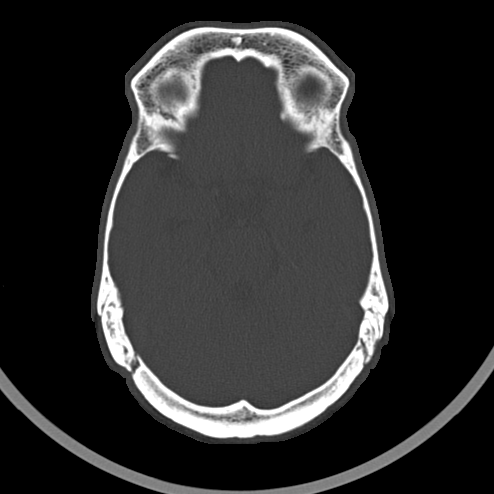
[im 27/64  bone]
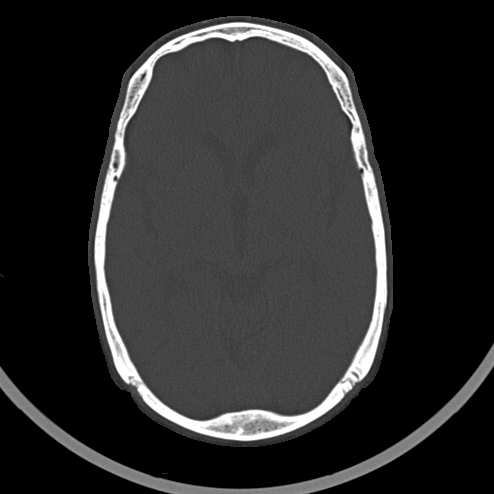
[im 37/64  bone]
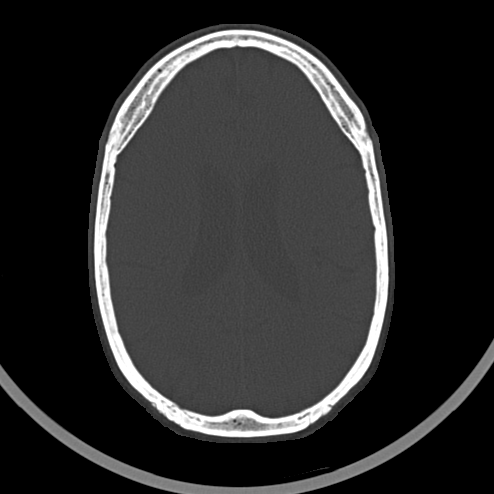
[im 44/64  bone]
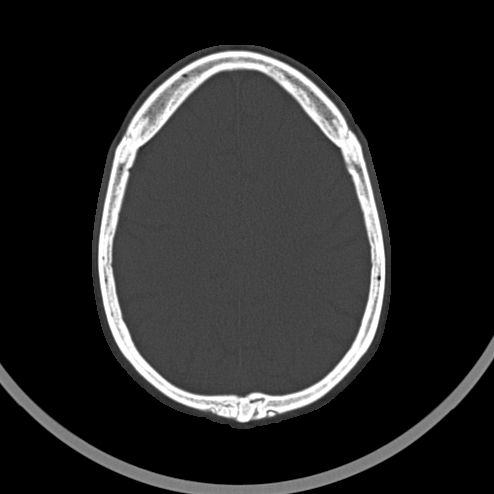

[Series 203: coronal st, idose (1) · coronal · 0.40mm/px · 3 of 70 slices shown]
[im 24/70  brain]
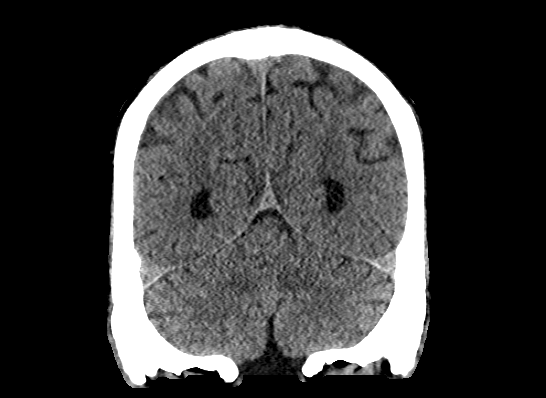
[im 31/70  brain]
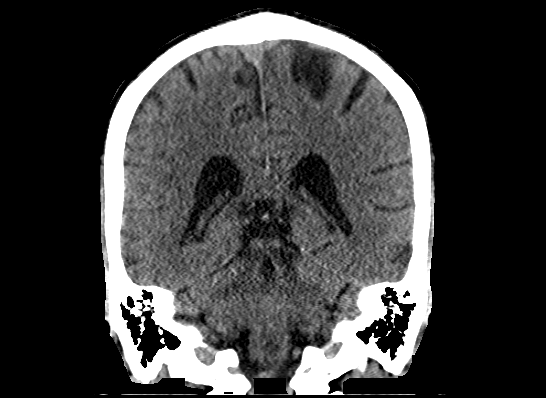
[im 39/70  brain]
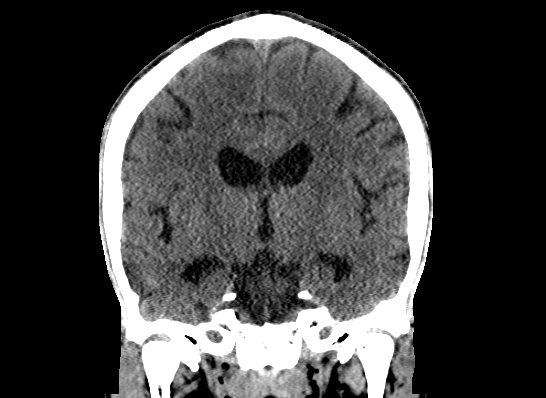

[Series 204: sagittal st, idose (1) · sagittal · 0.40mm/px · 3 of 72 slices shown]
[im 24/72  brain]
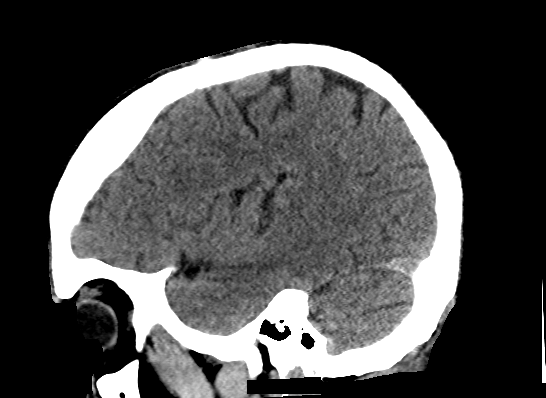
[im 36/72  brain]
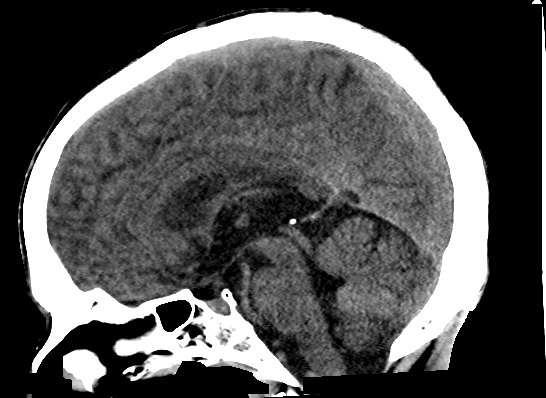
[im 48/72  brain]
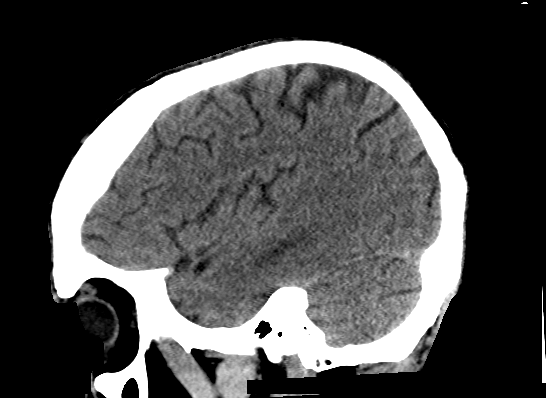

[20 of 47 positions shown; findings below may reference images not displayed]

FINDINGS: Brain: No acute intracranial abnormality. Specifically, no
hemorrhage, hydrocephalus, mass lesion, acute infarction, or
significant intracranial injury.

Vascular: No hyperdense vessel or unexpected calcification.

Skull: No acute calvarial abnormality.

Sinuses/Orbits: Visualized paranasal sinuses and mastoids clear.
Orbital soft tissues unremarkable.

Other: None
IMPRESSION: No acute intracranial abnormality.
